# Patient Record
Sex: Female | Born: 1990 | Race: Black or African American | Hispanic: No | Marital: Single | Smoking: Never smoker
Health system: Southern US, Community
[De-identification: ages and names within clinical notes are randomized; demographics above are authoritative.]

## PROBLEM LIST (undated history)

## (undated) ENCOUNTER — Inpatient Hospital Stay (HOSPITAL_COMMUNITY): Payer: Self-pay

---

## 2015-06-21 ENCOUNTER — Ambulatory Visit (INDEPENDENT_AMBULATORY_CARE_PROVIDER_SITE_OTHER): Payer: BLUE CROSS/BLUE SHIELD | Admitting: Emergency Medicine

## 2015-06-21 VITALS — BP 110/76 | HR 74 | Temp 97.8°F | Resp 16 | Ht 67.0 in | Wt 105.0 lb

## 2015-06-21 DIAGNOSIS — Z23 Encounter for immunization: Secondary | ICD-10-CM

## 2015-06-21 NOTE — Patient Instructions (Signed)
Hepatitis B Vaccine: What You Need to Know  1. What is hepatitis B?  Hepatitis B is a serious infection that affects the liver. It is caused by the hepatitis B virus.   · In 2009, about 38,000 people became infected with hepatitis B.  · Each year about 2,000 to 4,000 people die in the [place] from cirrhosis or liver cancer caused by hepatitis B.  Hepatitis B can cause:  · Acute (short-term) illness. This can lead to:  · loss of appetite  · tiredness  · pain in muscles, joints, and stomach  · diarrhea and vomiting  · jaundice (yellow skin or eyes)  Acute illness, with symptoms, is more common among adults. Children who become infected usually do not have symptoms.  · Chronic (long-term) infection. Some people go on to develop chronic hepatitis B infection. Most of them do not have symptoms, but the infection is still very serious, and can lead to:  · liver damage (cirrhosis)  · liver cancer  · death  Chronic infection is more common among infants and children than among adults. People who are chronically infected can spread hepatitis B virus to others, even if they don't look or feel sick. Up to 1.4 million people in the [place] may have chronic hepatitis B infection.   Hepatitis B virus is easily spread through contact with the blood or other body fluids of an infected person. People can also be infected from contact with a contaminated object, where the virus can live for up to 7 days.  · A baby whose mother is infected can be infected at birth;  · Children, adolescents, and adults can become infected by:  ¨ contact with blood and body fluids through breaks in the skin such as bites, cuts, or sores;  ¨ contact with objects that have blood or body fluids on them such as toothbrushes, razors, or monitoring and treatment devices for diabetes;  ¨ having unprotected sex with an infected person;  ¨ sharing needles when injecting drugs;  ¨ being stuck with a used needle.  2. Hepatitis B vaccine: Why get  vaccinated?  Hepatitis B vaccine can prevent hepatitis B, and the serious consequences of hepatitis B infection, including liver cancer and cirrhosis.  Hepatitis B vaccine may be given by itself or in the same shot with other vaccines.  Routine hepatitis B vaccination was recommended for some [place] adults and children beginning in 1982, and for all children in 1991. Since 1990, new hepatitis B infections among children and adolescents have dropped by more than 95%--and by 75% in other age groups.  Vaccination gives long-term protection from hepatitis B infection, possibly lifelong.  3. Who should get hepatitis B vaccine and when?  Children and adolescents  · Babies normally get 3 doses of hepatitis B vaccine:  · 1st Dose: Birth  · 2nd Dose: 1-2 months of age  · 3rd Dose: 6-18 months of age  Some babies might get 4 doses, for example, if a combination vaccine containing hepatitis B is used. (This is a single shot containing several vaccines.) The extra dose is not harmful.  · Anyone through 24 years of age who didn't get the vaccine when they were younger should also be vaccinated.  Adults  · All unvaccinated adults at risk for hepatitis B infection should be vaccinated. This includes:  · sex partners of people infected with hepatitis B,  · men who have sex with men,  · people who inject street drugs,  ·   people with more than one sex partner,  · people with chronic liver or kidney disease,  · people under 60 years of age with diabetes,  · people with jobs that expose them to human blood or other body fluids,  · household contacts of people infected with hepatitis B,  · residents and staff in institutions for the developmentally disabled,  · kidney dialysis patients,  · people who travel to countries where hepatitis B is common,  · people with HIV infection.  · Other people may be encouraged by their doctor to get hepatitis B vaccine; for example, adults 60 and older with diabetes. Anyone else who wants to be protected  from hepatitis B infection may get the vaccine.  · Pregnant women who are at risk for one of the reasons stated above should be vaccinated. Other pregnant women who want protection may be vaccinated.  Adults getting hepatitis B vaccine should get 3 doses--with the second dose given 4 weeks after the first and the third dose 5 months after the second. Your doctor can tell you about other dosing schedules that might be used in certain circumstances.  4. Who should not get hepatitis B vaccine?  · Anyone with a life-threatening allergy to yeast, or to any other component of the vaccine, should not get hepatitis B vaccine. Tell your doctor if you have any severe allergies.  · Anyone who has had a life-threatening allergic reaction to a previous dose of hepatitis B vaccine should not get another dose.  · Anyone who is moderately or severely ill when a dose of vaccine is scheduled should probably wait until they recover before getting the vaccine.  Your doctor can give you more information about these precautions.  Note: You might be asked to wait 28 days before donating blood after getting hepatitis B vaccine. This is because the screening test could mistake vaccine in the bloodstream (which is not infectious) for hepatitis B infection.  5. What are the risks from hepatitis B vaccine?  Hepatitis B is a very safe vaccine. Most people do not have any problems with it.  The vaccine contains non-infectious material, and cannot cause hepatitis B infection.  Some mild problems have been reported:  · Soreness where the shot was given (up to about 1 person in 4).  · Temperature of 99.9°F or higher (up to about 1 person in 15).  Severe problems are extremely rare. Severe allergic reactions are believed to occur about once in 1.1 million doses.  A vaccine, like any medicine, could cause a serious reaction. But the risk of a vaccine causing serious harm, or death, is extremely small. More than 100 million people in the [place]  have been vaccinated with hepatitis B vaccine.  6. What if there is a serious reaction?  What should I look for?  · Look for anything that concerns you, such as signs of a severe allergic reaction, very high fever, or behavior changes.  Signs of a severe allergic reaction can include hives, swelling of the face and throat, difficulty breathing, a fast heartbeat, dizziness, and weakness. These would start a few minutes to a few hours after the vaccination.  What should I do?  · If you think it is a severe allergic reaction or other emergency that can't wait, call 9-1-1 or get the person to the nearest hospital. Otherwise, call your doctor.  · Afterward, the reaction should be reported to the Vaccine Adverse Event Reporting System (VAERS). Your doctor might file   this report, or you can do it yourself through the VAERS web site at www.vaers.hhs.gov, or by calling 1-800-822-7967.  VAERS is only for reporting reactions. They do not give medical advice.  7. The National Vaccine Injury Compensation Program  The National Vaccine Injury Compensation Program (VICP) is a federal program that was created to compensate people who may have been injured by certain vaccines.  Persons who believe they may have been injured by a vaccine can learn about the program and about filing a claim by calling 1-800-338-2382 or visiting the VICP website at www.hrsa.gov/vaccinecompensation.  8. How can I learn more?  · Ask your doctor.  · Call your local or state health department.  · Contact the Centers for Disease Control and Prevention (CDC):  ¨ Call 1-800-232-4636 (1-800-CDC-INFO) or  ¨ Visit CDC's website at www.cdc.gov/vaccines  CDC Hepatitis B Interim VIS (11/13/10)  Document Released: 07/23/2006 Document Revised: 02/12/2014 Document Reviewed: 11/09/2013  ExitCare® Patient Information ©2015 [employer]. This information is not intended to replace advice given to you by your health care provider. Make sure you discuss any questions you  have with your health care provider.  Tdap Vaccine (Tetanus, Diphtheria, Pertussis): What You Need to Know  1. Why get vaccinated?  Tetanus, diphtheria and pertussis can be very serious diseases, even for adolescents and adults. Tdap vaccine can protect us from these diseases.  TETANUS (Lockjaw) causes painful muscle tightening and stiffness, usually all over the body.  · It can lead to tightening of muscles in the head and neck so you can't open your mouth, swallow, or sometimes even breathe. Tetanus kills about 1 out of 5 people who are infected.  DIPHTHERIA can cause a thick coating to form in the back of the throat.  · It can lead to breathing problems, paralysis, heart failure, and death.  PERTUSSIS (Whooping Cough) causes severe coughing spells, which can cause difficulty breathing, vomiting and disturbed sleep.  · It can also lead to weight loss, incontinence, and rib fractures. Up to 2 in 100 adolescents and 5 in 100 adults with pertussis are hospitalized or have complications, which could include pneumonia or death.  These diseases are caused by bacteria. Diphtheria and pertussis are spread from person to person through coughing or sneezing. Tetanus enters the body through cuts, scratches, or wounds.  Before vaccines, the [place] saw as many as 200,000 cases a year of diphtheria and pertussis, and hundreds of cases of tetanus. Since vaccination began, tetanus and diphtheria have dropped by about 99% and pertussis by about 80%.  2. Tdap vaccine  Tdap vaccine can protect adolescents and adults from tetanus, diphtheria, and pertussis. One dose of Tdap is routinely given at age 11 or 12. People who did not get Tdap at that age should get it as soon as possible.  Tdap is especially important for health care professionals and anyone having close contact with a baby younger than 12 months.  Pregnant women should get a dose of Tdap during every pregnancy, to protect the newborn from pertussis. Infants are  most at risk for severe, life-threatening complications from pertussis.  A similar vaccine, called Td, protects from tetanus and diphtheria, but not pertussis. A Td booster should be given every 10 years. Tdap may be given as one of these boosters if you have not already gotten a dose. Tdap may also be given after a severe cut or burn to prevent tetanus infection.  Your doctor can give you more information.  Tdap may safely   be given at the same time as other vaccines.  3. Some people should not get this vaccine  · If you ever had a life-threatening allergic reaction after a dose of any tetanus, diphtheria, or pertussis containing vaccine, OR if you have a severe allergy to any part of this vaccine, you should not get Tdap. Tell your doctor if you have any severe allergies.  · If you had a coma, or long or multiple seizures within 7 days after a childhood dose of DTP or DTaP, you should not get Tdap, unless a cause other than the vaccine was found. You can still get Td.  · Talk to your doctor if you:  ¨ have epilepsy or another nervous system problem,  ¨ had severe pain or swelling after any vaccine containing diphtheria, tetanus or pertussis,  ¨ ever had Guillain-Barré Syndrome (GBS),  ¨ aren't feeling well on the day the shot is scheduled.  4. Risks of a vaccine reaction  With any medicine, including vaccines, there is a chance of side effects. These are usually mild and go away on their own, but serious reactions are also possible.  Brief fainting spells can follow a vaccination, leading to injuries from falling. Sitting or lying down for about 15 minutes can help prevent these. Tell your doctor if you feel dizzy or light-headed, or have vision changes or ringing in the ears.  Mild problems following Tdap  (Did not interfere with activities)  · Pain where the shot was given (about 3 in 4 adolescents or 2 in 3 adults)  · Redness or swelling where the shot was given (about 1 person in 5)  · Mild fever of at least  100.4°F (up to about 1 in 25 adolescents or 1 in 100 adults)  · Headache (about 3 or 4 people in 10)  · Tiredness (about 1 person in 3 or 4)  · Nausea, vomiting, diarrhea, stomach ache (up to 1 in 4 adolescents or 1 in 10 adults)  · Chills, body aches, sore joints, rash, swollen glands (uncommon)  Moderate problems following Tdap  (Interfered with activities, but did not require medical attention)  · Pain where the shot was given (about 1 in 5 adolescents or 1 in 100 adults)  · Redness or swelling where the shot was given (up to about 1 in 16 adolescents or 1 in 25 adults)  · Fever over 102°F (about 1 in 100 adolescents or 1 in 250 adults)  · Headache (about 3 in 20 adolescents or 1 in 10 adults)  · Nausea, vomiting, diarrhea, stomach ache (up to 1 or 3 people in 100)  · Swelling of the entire arm where the shot was given (up to about 3 in 100).  Severe problems following Tdap  (Unable to perform usual activities; required medical attention)  · Swelling, severe pain, bleeding and redness in the arm where the shot was given (rare).  A severe allergic reaction could occur after any vaccine (estimated less than 1 in a million doses).  5. What if there is a serious reaction?  What should I look for?  · Look for anything that concerns you, such as signs of a severe allergic reaction, very high fever, or behavior changes.  Signs of a severe allergic reaction can include hives, swelling of the face and throat, difficulty breathing, a fast heartbeat, dizziness, and weakness. These would start a few minutes to a few hours after the vaccination.  What should I do?  · If you   think it is a severe allergic reaction or other emergency that can't wait, call 9-1-1 or get the person to the nearest hospital. Otherwise, call your doctor.  · Afterward, the reaction should be reported to the "Vaccine Adverse Event Reporting System" (VAERS). Your doctor might file this report, or you can do it yourself through the VAERS web site at  www.vaers.hhs.gov, or by calling 1-800-822-7967.  VAERS is only for reporting reactions. They do not give medical advice.   6. The National Vaccine Injury Compensation Program  The National Vaccine Injury Compensation Program (VICP) is a federal program that was created to compensate people who may have been injured by certain vaccines.  Persons who believe they may have been injured by a vaccine can learn about the program and about filing a claim by calling 1-800-338-2382 or visiting the VICP website at www.hrsa.gov/vaccinecompensation.  7. How can I learn more?  · Ask your doctor.  · Call your local or state health department.  · Contact the Centers for Disease Control and Prevention (CDC):  ¨ Call 1-800-232-4636 or visit CDC's website at www.cdc.gov/vaccines.  CDC Tdap Vaccine VIS (02/18/12)  Document Released: 03/29/2012 Document Revised: 02/12/2014 Document Reviewed: 01/10/2014  ExitCare® Patient Information ©2015 [employer]. This information is not intended to replace advice given to you by your health care provider. Make sure you discuss any questions you have with your health care provider.

## 2015-06-21 NOTE — Progress Notes (Signed)
   Subjective:  Patient ID: Evelyn Cox, female    DOB: 1991-06-01  Age: 24 y.o. MRN: [id number]  CC: Immunizations   HPI Evelyn Cox presents  for immunizations .  She has never had hepatitis B series and is due for a tetanus booster she has no current complaint  History [place] has no past medical history on file.   She has no past surgical history on file.   Her  family history is not on file.  She   reports that she has never smoked. She has never used smokeless tobacco. She reports that she drinks about 1.2 oz of alcohol per week. She reports that she does not use illicit drugs.  No outpatient prescriptions prior to visit.   No facility-administered medications prior to visit.    Social History   Social History  . Marital Status: Single    Spouse Name: N/A  . Number of Children: N/A  . Years of Education: N/A   Social History Main Topics  . Smoking status: Never Smoker   . Smokeless tobacco: Never Used  . Alcohol Use: 1.2 oz/week    2 Standard drinks or equivalent per week  . Drug Use: No  . Sexual Activity: Not Asked   Other Topics Concern  . None   Social History Narrative  . None     Review of Systems  Constitutional: Negative for fever, chills and appetite change.  HENT: Negative for congestion, ear pain, postnasal drip, sinus pressure and sore throat.   Eyes: Negative for pain and redness.  Respiratory: Negative for cough, shortness of breath and wheezing.   Cardiovascular: Negative for leg swelling.  Gastrointestinal: Negative for nausea, vomiting, abdominal pain, diarrhea, constipation and blood in stool.  Endocrine: Negative for polyuria.  Genitourinary: Negative for dysuria, urgency, frequency and flank pain.  Musculoskeletal: Negative for gait problem.  Skin: Negative for rash.  Neurological: Negative for weakness and headaches.  Psychiatric/Behavioral: Negative for confusion and decreased concentration. The patient is not  nervous/anxious.     Objective:  BP 110/76 mmHg  Pulse 74  Temp(Src) 97.8 F (36.6 C) (Oral)  Resp 16  Ht  (1.702 m)  Wt 105 lb (47.628 kg)  BMI 16.44 kg/m2  SpO2 98%  LMP 06/07/2015 (Approximate)  Physical Exam  Constitutional: She is oriented to person, place, and time. She appears well-developed and well-nourished.  HENT:  Head: Normocephalic and atraumatic.  Eyes: Conjunctivae are normal. Pupils are equal, round, and reactive to light.  Pulmonary/Chest: Effort normal.  Musculoskeletal: She exhibits no edema.  Neurological: She is alert and oriented to person, place, and time.  Skin: Skin is dry.  Psychiatric: She has a normal mood and affect. Her behavior is normal. Thought content normal.      Assessment & Plan:   [place] was seen today for immunizations.  Diagnoses and all orders for this visit:  Need for hepatitis B vaccination  Need for Tdap vaccination  Other orders -     Tdap vaccine greater than or equal to 7yo IM -     Hepatitis B vaccine adult IM   [name] does not currently have medications on file.  No orders of the defined types were placed in this encounter.    Appropriate red flag conditions were discussed with the patient as well as actions that should be taken.  Patient expressed his understanding.  Follow-up: Return if symptoms worsen or fail to improve.  Evelyn Dane, MD

## 2015-10-08 ENCOUNTER — Emergency Department (HOSPITAL_COMMUNITY)
Admission: EM | Admit: 2015-10-08 | Discharge: 2015-10-08 | Disposition: A | Discharge status: Home / Self Care | Payer: BLUE CROSS/BLUE SHIELD | Attending: Emergency Medicine | Admitting: Emergency Medicine

## 2015-10-08 ENCOUNTER — Encounter (HOSPITAL_COMMUNITY): Payer: Self-pay | Admitting: Vascular Surgery

## 2015-10-08 DIAGNOSIS — J069 Acute upper respiratory infection, unspecified: Secondary | ICD-10-CM | POA: Diagnosis not present

## 2015-10-08 DIAGNOSIS — R05 Cough: Secondary | ICD-10-CM | POA: Diagnosis present

## 2015-10-08 DIAGNOSIS — R110 Nausea: Secondary | ICD-10-CM | POA: Insufficient documentation

## 2015-10-08 LAB — RAPID STREP SCREEN (MED CTR MEBANE ONLY): STREPTOCOCCUS, GROUP A SCREEN (DIRECT): NEGATIVE

## 2015-10-08 MED ORDER — IBUPROFEN 200 MG PO TABS
600.0000 mg | ORAL_TABLET | Freq: Once | ORAL | Status: DC
  Filled 2015-10-08: qty 3

## 2015-10-08 MED ORDER — GUAIFENESIN 100 MG/5ML PO LIQD: 100.0000 mg | ORAL | Status: DC | PRN

## 2015-10-08 MED ORDER — IBUPROFEN 100 MG/5ML PO SUSP
600.0000 mg | Freq: Once | ORAL | Status: AC
  Administered 2015-10-08: 600 mg via ORAL
  Filled 2015-10-08: qty 30

## 2015-10-08 MED ORDER — IBUPROFEN 100 MG/5ML PO SUSP: 600.0000 mg | Freq: Three times a day (TID) | ORAL | Status: DC | PRN

## 2015-10-08 MED ORDER — ALBUTEROL SULFATE HFA 108 (90 BASE) MCG/ACT IN AERS: 1.0000 | INHALATION_SPRAY | Freq: Four times a day (QID) | RESPIRATORY_TRACT | Status: DC | PRN

## 2015-10-08 NOTE — ED Provider Notes (Signed)
CSN: [id number]     Arrival date & time 10/08/15  1110 History  By signing my name below, I, Jarvis Morganaylor Ferguson, attest that this documentation has been prepared under the direction and in the presence of Trixie DredgeEmily Wyllow Seigler, PA-C Electronically Signed: Jarvis Morganaylor Ferguson, ED Scribe. 10/08/2015. 2:57 PM.   Chief Complaint  Patient presents with  . Cough  . Nausea   The history is provided by the patient. No language interpreter was used.    HPI Comments: Ivor CostaBrittany Nakata is a 24 y.o. female with no PMHx who presents to the Emergency Department complaining of an intermittent, moderate, cough productive of sputum onset 5 days. She reports associated chest pain and lightheadedness that is only present when coughing, along with nausea, sore throat, subjective fever, congestion and rhinorrhea. Pt notes she has been taking Tylenol and TheraFlu with no significant relief. She denies any aggravating factors. Pt notes that several coworkers have had URIs. She denies any family h/o DVT or PE. She denies any leg swelling, recent immobilization, recent hospitalization, or recent travel. Pt notes she has had a decreased appetite. She denies any SOB, trouble breathing, trouble swallowing, vomiting, diarrhea, abdominal pain, facial pain, or urinary symptoms.  History reviewed. No pertinent past medical history. History reviewed. No pertinent past surgical history. No family history on file. Social History  Substance Use Topics  . Smoking status: Never Smoker   . Smokeless tobacco: Never Used  . Alcohol Use: 1.2 oz/week    2 Standard drinks or equivalent per week   OB History    No data available     Review of Systems  Constitutional: Positive for fever (subjective). Negative for chills.  HENT: Positive for congestion and sore throat. Negative for trouble swallowing.   Respiratory: Positive for cough. Negative for shortness of breath.   Cardiovascular: Positive for chest pain (only when coughing). Negative for leg  swelling.  Gastrointestinal: Positive for nausea. Negative for vomiting, abdominal pain and diarrhea.  Genitourinary: Negative for dysuria, urgency, frequency and difficulty urinating.      Allergies  Review of patient's allergies indicates no known allergies.  Home Medications   Prior to Admission medications   Not on File   Triage Vitals: BP 113/92 mmHg  Pulse 70  Temp(Src) 97.9 F (36.6 C) (Oral)  Resp 16  SpO2 100%  LMP 09/29/2015 (Approximate)  Physical Exam  Constitutional: Vital signs are normal. She appears well-developed and well-nourished. She is active.  Non-toxic appearance. She does not have a sickly appearance. She does not appear ill. No distress.  HENT:  Head: Normocephalic and atraumatic.  Nose: Mucosal edema present.  Mouth/Throat: Posterior oropharyngeal edema and posterior oropharyngeal erythema present. No oropharyngeal exudate.  Erythema and edema of nasal mucosa  Eyes: Conjunctivae are normal.  Neck: Neck supple.  Cardiovascular: Normal rate and regular rhythm.   Pulmonary/Chest: Effort normal and breath sounds normal. No respiratory distress. She has no wheezes. She has no rales.  Musculoskeletal: She exhibits no edema.  Neurological: She is alert. She exhibits normal muscle tone.  Skin: She is not diaphoretic.  Psychiatric: She has a normal mood and affect. Her behavior is normal. Thought content normal.  Nursing note and vitals reviewed.   ED Course  Procedures (including critical care time)  DIAGNOSTIC STUDIES: Oxygen Saturation is 100% on RA, normal by my interpretation.    COORDINATION OF CARE: 3:00 PM- Will order 12 lead EKG and rapid strep screen. Pt advised of plan for treatment and pt agrees.   Labs  Review Labs Reviewed - No data to display  Imaging Review No results found. I have personally reviewed and evaluated these lab results as part of my medical decision-making.   EKG Interpretation None      MDM   Final  diagnoses:  URI (upper respiratory infection)   Afebrile, nontoxic patient with constellation of symptoms suggestive of viral syndrome.  No concerning findings on exam.  Discharged home with supportive care, PCP follow up.  Discussed result, findings, treatment, and follow up  with patient.  Pt given return precautions.  Pt verbalizes understanding and agrees with plan.       I personally performed the services described in this documentation, which was scribed in my presence. The recorded information has been reviewed and is accurate.      Trixie Dredge, PA-C 10/08/15 1732  Marily Memos, MD 10/10/15 1018

## 2015-10-08 NOTE — Discharge Instructions (Signed)
Read the information below.  Use the prescribed medication as directed.  Please discuss all new medications with your pharmacist.  You may return to the Emergency Department at any time for worsening condition or any new symptoms that concern you.  If you develop high fevers that do not resolve with tylenol or ibuprofen, you have difficulty swallowing or breathing, or you are unable to tolerate fluids by mouth, return to the ER for a recheck.    ° ° °Upper Respiratory Infection, Adult °Most upper respiratory infections (URIs) are a viral infection of the air passages leading to the lungs. A URI affects the nose, throat, and upper air passages. The most common type of URI is nasopharyngitis and is typically referred to as "the common cold." °URIs run their course and usually go away on their own. Most of the time, a URI does not require medical attention, but sometimes a bacterial infection in the upper airways can follow a viral infection. This is called a secondary infection. Sinus and middle ear infections are common types of secondary upper respiratory infections. °Bacterial pneumonia can also complicate a URI. A URI can worsen asthma and chronic obstructive pulmonary disease (COPD). Sometimes, these complications can require emergency medical care and may be life threatening.  °CAUSES °Almost all URIs are caused by viruses. A virus is a type of germ and can spread from one person to another.  °RISKS FACTORS °You may be at risk for a URI if:  °· You smoke.   °· You have chronic heart or lung disease. °· You have a weakened defense (immune) system.   °· You are very young or very old.   °· You have nasal allergies or asthma. °· You work in crowded or poorly ventilated areas. °· You work in health care facilities or schools. °SIGNS AND SYMPTOMS  °Symptoms typically develop 2-3 days after you come in contact with a cold virus. Most viral URIs last 7-10 days. However, viral URIs from the influenza virus (flu virus)  can last 14-18 days and are typically more severe. Symptoms may include:  °· Runny or stuffy (congested) nose.   °· Sneezing.   °· Cough.   °· Sore throat.   °· Headache.   °· Fatigue.   °· Fever.   °· Loss of appetite.   °· Pain in your forehead, behind your eyes, and over your cheekbones (sinus pain). °· Muscle aches.   °DIAGNOSIS  °Your health care provider may diagnose a URI by: °· Physical exam. °· Tests to check that your symptoms are not due to another condition such as: °¨ Strep throat. °¨ Sinusitis. °¨ Pneumonia. °¨ Asthma. °TREATMENT  °A URI goes away on its own with time. It cannot be cured with medicines, but medicines may be prescribed or recommended to relieve symptoms. Medicines may help: °· Reduce your fever. °· Reduce your cough. °· Relieve nasal congestion. °HOME CARE INSTRUCTIONS  °· Take medicines only as directed by your health care provider.   °· Gargle warm saltwater or take cough drops to comfort your throat as directed by your health care provider. °· Use a warm mist humidifier or inhale steam from a shower to increase air moisture. This may make it easier to breathe. °· Drink enough fluid to keep your urine clear or pale yellow.   °· Eat soups and other clear broths and maintain good nutrition.   °· Rest as needed.   °· Return to work when your temperature has returned to normal or as your health care provider advises. You may need to stay home longer to avoid infecting   others. You can also use a face mask and careful hand washing to prevent spread of the virus. °· Increase the usage of your inhaler if you have asthma.   °· Do not use any tobacco products, including cigarettes, chewing tobacco, or electronic cigarettes. If you need help quitting, ask your health care provider. °PREVENTION  °The best way to protect yourself from getting a cold is to practice good hygiene.  °· Avoid oral or hand contact with people with cold symptoms.   °· Wash your hands often if contact occurs.   °There is  no clear evidence that vitamin C, vitamin E, echinacea, or exercise reduces the chance of developing a cold. However, it is always recommended to get plenty of rest, exercise, and practice good nutrition.  °SEEK MEDICAL CARE IF:  °· You are getting worse rather than better.   °· Your symptoms are not controlled by medicine.   °· You have chills. °· You have worsening shortness of breath. °· You have brown or red mucus. °· You have yellow or brown nasal discharge. °· You have pain in your face, especially when you bend forward. °· You have a fever. °· You have swollen neck glands. °· You have pain while swallowing. °· You have white areas in the back of your throat. °SEEK IMMEDIATE MEDICAL CARE IF:  °· You have severe or persistent: °¨ Headache. °¨ Ear pain. °¨ Sinus pain. °¨ Chest pain. °· You have chronic lung disease and any of the following: °¨ Wheezing. °¨ Prolonged cough. °¨ Coughing up blood. °¨ A change in your usual mucus. °· You have a stiff neck. °· You have changes in your: °¨ Vision. °¨ Hearing. °¨ Thinking. °¨ Mood. °MAKE SURE YOU:  °· Understand these instructions. °· Will watch your condition. °· Will get help right away if you are not doing well or get worse. °  °This information is not intended to replace advice given to you by your health care provider. Make sure you discuss any questions you have with your health care provider. °  °Document Released: 03/24/2001 Document Revised: 02/12/2015 Document Reviewed: 01/03/2014 °Elsevier Interactive Patient Education ©2016 [employer]vier Inc[employer] ° ° ° °Emergency Department Resource Guide °1) Find a Doctor and Pay Out of Pocket °Although you won't have to find out who is covered by your insurance plan, it is a good idea to ask around and get recommendations. You will then need to call the office and see if the doctor you have chosen will accept you as a new patient and what types of options they offer for patients who are self-pay. Some doctors offer discounts or  will set up payment plans for their patients who do not have insurance, but you will need to ask so you aren't surprised when you get to your appointment. ° °2) Contact Your Local Health Department °Not all health departments have doctors that can see patients for sick visits, but many do, so it is worth a call to see if yours does. If you don't know where your local health department is, you can check in your phone book. The CDC also has a tool to help you locate your state's health department, and many state websites also have listings of all of their local health departments. ° °3) Find a Walk-in Clinic °If your illness is not likely to be very severe or complicated, you may want to try a walk in clinic. These are popping up all over the country in pharmacies, drugstores, and shopping centers. They're usually staffed   by nurse practitioners or physician assistants that have been trained to treat common illnesses and complaints. They're usually fairly quick and inexpensive. However, if you have serious medical issues or chronic medical problems, these are probably not your best option. ° °No Primary Care Doctor: °- Call [facility] at  832-8000 - they can help you locate a primary care doctor that  accepts your insurance, provides certain services, etc. °- Physician Referral Service- 1-800-533-3463 ° °Chronic Pain Problems: °Organization         Address  Phone   Notes  °[facility]  (336) 297-2271 Patients need to be referred by their primary care doctor.  ° °Medication Assistance: °Organization         Address  Phone   Notes  °[place] [facility] [address] °[place], [place] [address] (336) 641-8030 --Must be a resident of [place] °-- Must have NO insurance coverage whatsoever (no Medicaid/ Medicare, etc.) °-- The pt. MUST have a primary care doctor that directs their care regularly and follows them in the community °  °[facility]  (866)  331-1348   °[employer]  (888) 892-1162   ° °Agencies that provide inexpensive medical care: °Organization         Address  Phone   Notes  °[facility]  (336) 832-8035   °[facility]    (336) 832-7272   °[facility] [address] °[place], [place] [address] (336) 832-4777   °[facility] [address], °[place] (336) 271-4999   °[facility]    (336) 373-0678   °[facility]    (336) 272-1050   °[facility] ° [address], [place] Phone:  (336) 832-4444, Fax:  (336) 832-4440 Hours of Operation:  9 am - 6 pm, M-F.  Also accepts Medicaid/Medicare and self-pay.  °[facility] ° [address], [place] Phone: (336) [phone], Fax: (336) [phone]. Hours of Operation:  8:30 am - 5:30 pm, M-F.  Also accepts Medicaid and self-pay.  °[facility] [place] [address], [place] Phone: (336) 878-6027   °[facility] [address], [place], [place] (336)723-1848, Ext. 123 Mondays & Thursdays: 7-9 AM.  First 15 patients are seen on a first come, first serve basis. °  ° °Medicaid-accepting [place] Providers: ° °Organization         Address  Phone   Notes  °[facility] [address], [place] (336) 641-2100 Also accepts self-pay patients.  °[facility] [address], [place] ° (336) 856-9996   °[facility] [address], [place] (336) 288-8857   °[facility] [address] [place] [address], [place] (336) 299-7000   °Veita Bland [address], [place]  ° (336) 373-1557 Only accepts [place] Access Medicaid patients after they have their name applied to their card.  ° °Self-Pay (no insurance) in [place]: ° °Organization         Address  Phone   Notes  °Sickle Cell Patients, [facility] [address], [place] (336) 832-1970   °[facility] [address],  (336) 832-4400   °[facility] [place] ° [address],  (336) 992-4800   °[facility]/Dr.   Osei-Bonsu ° [address] [place] [address], [place] or [address], [place] (336) [phone] Phone number for both [place] and [place] locations is the same.  °[facility] [address], [place] (336) 299-0000   °[facility] [address] [place] [address], [place] or [address] (336) 852-7530 °(336) 878-2260   °[facility] [address], [place] (336) 350-1642, phone; (336) 294-5005, fax Sees patients 1st and 3rd Saturday of every month.  Must not qualify for public or private insurance (i.e. Medicaid, Medicare, [place] Health Choice, Veterans' Benefits) • Household income should be no more than 200% of the poverty level •The clinic cannot treat you if you are pregnant or think you are pregnant • Sexually transmitted diseases are not treated at the clinic.  ° ° °Dental Care: °Organization         Address  Phone  Notes  °[facility] [address], [place] (336) 641-6152 Accepts children up to age 21 who are enrolled in Medicaid or [place] Health Choice; pregnant women with a Medicaid card; and children who have applied for Medicaid or [place] Health Choice, but were declined, whose parents can pay a reduced fee at time of service.  °[facility] [place]  [address], [place] (336) 641-7733 Accepts children up to age 21 who are enrolled in Medicaid or [place] Health Choice; pregnant women with a Medicaid card; and children who have applied for Medicaid or [place] Health Choice, but were declined, whose parents can pay a reduced fee at time of service.  °[facility] ° [address], [place] (336)  641-4533 Patients are seen by appointment only. Walk-ins are not accepted. [facility] will see patients 18 years of age and older. °Monday - Tuesday (8am-5pm) °Most Wednesdays (8:30-5pm) °$30 per visit, cash only  °[facility] ° [address], [place] (336) 641-4533 Patients are seen by appointment only. Walk-ins are not accepted. [facility] will see patients 18 years of age and older. °One Wednesday Evening (Monthly: Volunteer Based).  $30 per visit, cash only  °[facility]  (919) 537-3737 for adults; Children under age 4, call Graduate Pediatric Dentistry at ([phone]) [phone]. Children aged 4-14, please call (919) [phone] to request a pediatric application. ° Dental services are provided in all areas of dental care including fillings, crowns and bridges, complete and partial dentures, implants, gum treatment, root canals, and extractions. Preventive care is also provided. Treatment is provided to both adults and children. °Patients are selected via a lottery and there is often a waiting list. °  °[facility] [address], ° ° (336) 763-8833 [email or web address] °  °[facility] [address], [place], [place] (336)723-1848, Ext. 123 Second and Fourth Thursday of each month, opens at 6:30 AM; Clinic ends at 9 AM.  Patients are seen on a first-come first-served basis, and a limited number are seen during each clinic.  ° °[facility] ° [address], [place], [place] (336) 723-7904   Eligibility Requirements °You must have lived in [place], [place], or [place] counties for at least the last three months. °  You cannot be eligible for state or federal sponsored healthcare insurance, including Veterans Administration, Medicaid, or Medicare. °  You generally cannot be eligible for healthcare insurance through your employer.  °    How to apply: °Eligibility screenings are held every Tuesday and Wednesday afternoon  from 1:00 pm until 4:00 pm. You do not need an appointment for the interview!  °[facility] [address], [place], [place] 336-631-2330   °[facility]  336-342-8273   °[facility]  336-703-3100   °[facility]  336-570-6415   ° °Behavioral Health Resources in the Community: °Intensive Outpatient Programs °Organization         Address  Phone  Notes  °[facility] [address], [place], [place] 336-878-6098   °[facility] [address], [place], [place] 336-832-9800   °[facility] [address], [place], [place] ° 336-882-2125   °[facility] [address],  °[place], [place] 1-800-853-5163 or 336-641-4981   °Substance Abuse Resources °Organization         Address  Phone  Notes  °[facility]  336-882-2125   °[facility]  336-784-9470   °[facility]  336-285-9073   °[facility]  336-845-3988   °Residential & Outpatient Substance Abuse Program  1-800-659-3381   °Psychological Services °Organization         Address  Phone  Notes  °[facility]  336- 832-9600   °[facility]  336- 378-7881   °[facility] [address], [place] 1-800-853-5163 or 336-641-4981   ° °Mobile Crisis Teams °Organization         Address  Phone  Notes  °[facility], Mobile Crisis Care Unit  1-877-626-1772   °Assertive °[facility] ° [address] [place], [place] 336-834-9664   °Sharon DeEsch [address] °[place] 336-554-5454   ° °Self-Help/Support Groups °Organization         Address  Phone             Notes  °[facility] - variety of support groups  336- 373-1402 Call for more information  °Narcotics Anonymous (NA), [facility] [address], °[place]  2 meetings at this location  ° °Residential Treatment  Programs °Organization         Address  Phone  Notes  °[facility] [address],    °[place]  1-866-801-8205   °[facility] ° [address], [place], [place] 704-293-8524   °[facility] [address], [place] 336-845-3988 Admissions: 8am-3pm M-F  °[facility] [address]    °[place], [place] 336-841-1104   °[facility] [address], [place], [place] 336-379-7146   °[facility] [address]  °[place], [place] 336-285-9073   °[facility] - Intensive Outpatient [address], , [place] 336-852-3033   °[facility] [address]  °[place], [place] 1-877-615-2722 or 336-784-9470   °[facility] [address] [place], [place] 336-227-7417 Accepts Medicaid  °[facility] [address]  ° [place] 1-800-659-3381 Substance Abuse/Addiction Treatment  ° °[place] Behavioral Health Resources °Organization         Address  Phone  Notes  °[facility]  (888) 581-9988   °Julie Brannon, PhD [address] [place], [place]   (336) 349-5553 or (336) 951-0000   °[facility]   [address] °[place],  (336) 349-4454   °[facility] [address], [place], [place] (336) 342-8316 Insurance/Medicaid/sponsorship through [facility]  °[facility] [address]                                    [place], [place] (336) 342-8316 Therapy/tele-psych/case  °[facility] [address]  ° [place], [place] (336) 349-2233    °Dr. Arfeen  (336) 349-4544   °[facility] [place]  [employer] [place] [facility] 1) [address], [place] °2) [address], [place] °3)  [address], [place] (336) 349-3220 °(336) 342-7768 ° °(336) 342-8140   °[place] [facility] (336) 342-1394 or (336) 342-3537 (After Hours)    ° ° ° °

## 2015-10-08 NOTE — ED Notes (Signed)
Pt reports to the ED for eval of productive cough, nasal congestion/drainage, sore throat, chest soreness, and nausea. Pt reports her symptoms began on Friday. Reports she has been taking Tylenol and Theraflu but it is not helping. Pt denies any V/D, urinary symptoms, or abd pain. Pt A&Ox4, resp e/u, and skin warm and dry.

## 2015-10-10 LAB — CULTURE, GROUP A STREP

## 2015-10-13 NOTE — L&D Delivery Note (Signed)
25 y.o. [id number] at [id number] delivered a viable female infant in cephalic, OA position.  nuchal cord x 1, easily reduced. Right anterior shoulder delivered with ease. 60 sec delayed cord clamping. Cord clamped x2 and cut. Placenta delivered spontaneously intact, with 3VC. Fundus firm on exam with massage and pitocin. Good hemostasis noted.  Laceration: none Suture: n/a EBL: 150cc Good hemostasis noted.  Mom and baby recovering in LDR.    Apgars: 6,8 Weight: pending, skin to skin, see delivery summary  Cord blood obtained: yes   WALLACE, NOAH I, DO PGY-3 09/08/2016, 6:26 PM    OB FELLOW DELIVERY ATTESTATION  I was gloved and present for the delivery in its entirety, and I agree with the above resident's note.    Ernestina PennaNicholas Schenk, MD 6:38 PM

## 2016-01-23 ENCOUNTER — Emergency Department (HOSPITAL_COMMUNITY)
Admission: EM | Admit: 2016-01-23 | Discharge: 2016-01-23 | Disposition: A | Discharge status: Home / Self Care | Payer: BLUE CROSS/BLUE SHIELD | Attending: Emergency Medicine | Admitting: Emergency Medicine

## 2016-01-23 ENCOUNTER — Encounter (HOSPITAL_COMMUNITY): Payer: Self-pay

## 2016-01-23 DIAGNOSIS — Z79899 Other long term (current) drug therapy: Secondary | ICD-10-CM | POA: Insufficient documentation

## 2016-01-23 DIAGNOSIS — R110 Nausea: Secondary | ICD-10-CM | POA: Insufficient documentation

## 2016-01-23 DIAGNOSIS — J069 Acute upper respiratory infection, unspecified: Secondary | ICD-10-CM | POA: Diagnosis not present

## 2016-01-23 DIAGNOSIS — M791 Myalgia: Secondary | ICD-10-CM | POA: Diagnosis present

## 2016-01-23 MED ORDER — OXYMETAZOLINE HCL 0.05 % NA SOLN: 1.0000 | Freq: Two times a day (BID) | NASAL | Status: DC

## 2016-01-23 NOTE — ED Notes (Signed)
Patient here with generalized body aches, fever and cough x 2 days

## 2016-01-23 NOTE — Discharge Instructions (Signed)
Take your medication as prescribed. I also recommend taking Tylenol and/or ibuprofen as prescribed over-the-counter as needed for headache and body aches. Continue to drink at least six 8 ounce glasses of water daily to remain hydrated at home. You may also take Delsym or other over-the-counter antitussives as needed for ear cough. Follow-up with one of the physicians listed in the resource guide provided below. Return to the emergency department if symptoms worsen or new onset of uncontrollable fever, decreased oral intake, vomiting, difficulty breathing, wheezing, chest pain, coughing up blood, abdominal pain.

## 2016-01-23 NOTE — ED Notes (Signed)
Declined W/C at D/C and was escorted to lobby by RN. 

## 2016-01-23 NOTE — ED Provider Notes (Signed)
CSN: [id number]     Arrival date & time 01/23/16  0909 History  By signing my name below, I, Ronney LionSuzanne Le, attest that this documentation has been prepared under the direction and in the presence of Melburn HakeNicole Nadeau, [place]. Electronically Signed: Ronney LionSuzanne Le, ED Scribe. 01/23/2016. 12:16 PM.    Chief Complaint  Patient presents with  . Generalized Body Aches   The history is provided by the patient. No language interpreter was used.    HPI Comments: Ivor CostaBrittany Shrewsberry is a 25 y.o. female who presents to the Emergency Department with multiple complaints, including generalized myalgias, that began 2 days ago. Associated symptoms include a frontal sinus pressure, nausea, sneezing, nasal congestion, yellow/occasionally blood-tinged rhinorrhea, subjective fever and chills, productive cough, SOB, chest wall pain with coughing, and a scratchy throat (resolved). 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 She reports she had taken OTC medications, including Theraflu and Mucinex, with minimal relief. She denies a history of any chronic medical conditions. She denies eye itching, hemoptysis, wheezing, abdominal pain, vomiting, diarrhea, dysuria.  History reviewed. No pertinent past medical history. History reviewed. No pertinent past surgical history. No family history on file. Social History  Substance Use Topics  . Smoking status: Never Smoker   . Smokeless tobacco: Never Used  . Alcohol Use: 1.2 oz/week    2 Standard drinks or equivalent per week   OB History    No data available     Review of Systems  Constitutional: Positive for fever (subjective) and chills.  HENT: Positive for congestion, rhinorrhea, sneezing and sore throat (resolved).   Eyes: Negative for itching.  Respiratory: Positive for cough and shortness of breath. Negative for wheezing.   Cardiovascular: Positive for chest pain (chest wall pain associated with cough).   Gastrointestinal: Positive for nausea. Negative for vomiting, abdominal pain and diarrhea.  Genitourinary: Negative for dysuria and menstrual problem.  Musculoskeletal: Positive for myalgias (generalized).  Neurological: Positive for headaches.      Allergies  Review of patient's allergies indicates no known allergies.  Home Medications   Prior to Admission medications   Medication Sig Start Date End Date Taking? Authorizing Provider  albuterol (PROVENTIL HFA;VENTOLIN HFA) 108 (90 Base) MCG/ACT inhaler Inhale 1-2 puffs into the lungs every 6 (six) hours as needed for wheezing or shortness of breath. 10/08/15   Trixie DredgeEmily West, PA-C  guaiFENesin (ROBITUSSIN) 100 MG/5ML liquid Take 5-10 mLs (100-200 mg total) by mouth every 4 (four) hours as needed for cough. 10/08/15   Trixie DredgeEmily West, PA-C  ibuprofen (ADVIL,MOTRIN) 100 MG/5ML suspension Take 30 mLs (600 mg total) by mouth every 8 (eight) hours as needed for fever, mild pain or moderate pain. 10/08/15   Trixie DredgeEmily West, PA-C  oxymetazoline (AFRIN NASAL SPRAY) 0.05 % nasal spray Place 1 spray into both nostrils 2 (two) times daily. 01/23/16   Satira SarkNicole Elizabeth Nadeau, PA-C   BP 107/74 mmHg  Pulse 80  Temp(Src) 98.9 F (37.2 C)  Resp 18  SpO2 100%  LMP 12/18/2015 Physical Exam  Constitutional: She is oriented to person, place, and time. She appears well-developed and well-nourished.  HENT:  Head: Normocephalic and atraumatic.  Right Ear: Tympanic membrane normal.  Left Ear: Tympanic membrane normal.  Nose: Rhinorrhea present. Right sinus exhibits maxillary sinus tenderness. Right sinus exhibits no frontal sinus tenderness. Left sinus exhibits maxillary sinus tenderness. Left sinus exhibits no frontal sinus tenderness.  Mouth/Throat: Uvula is midline, oropharynx is clear and moist and mucous  membranes are normal. No oropharyngeal exudate, posterior oropharyngeal edema, posterior oropharyngeal erythema or tonsillar abscesses.  Eyes: Conjunctivae and  EOM are normal. Right eye exhibits no discharge. Left eye exhibits no discharge. No scleral icterus.  Neck: Normal range of motion. Neck supple.  Cardiovascular: Normal rate, regular rhythm, normal heart sounds and intact distal pulses.   Pulmonary/Chest: Effort normal and breath sounds normal. No respiratory distress. She has no wheezes. She has no rales.  Abdominal: Soft. Bowel sounds are normal. There is no tenderness.  Musculoskeletal: She exhibits no edema.  Lymphadenopathy:    She has no cervical adenopathy.  Neurological: She is alert and oriented to person, place, and time.  Skin: Skin is warm and dry.  Nursing note and vitals reviewed.   ED Course  Procedures (including critical care time)  DIAGNOSTIC STUDIES: Oxygen Saturation is 100% on RA, normal by my interpretation.    COORDINATION OF CARE: 12:01 PM - Discussed treatment plan with pt at bedside. Pt verbalized understanding and agreed to plan.  MDM   Final diagnoses:  URI (upper respiratory infection)   Patient's symptoms are consistent with URI, likely viral etiology. Discussed that antibiotics are not indicated for viral infections. Pt will be discharged with symptomatic treatment, including nasal spray.  Discussed OTC antihistamine medications in case pt starts to develop seasonal allergy symptoms, including eye itching. Advised alternating Tylenol and ibuprofen 600 mg as needed for fever, body aches and headache/sinus pressure. Strict return precautions given. Will give work note for 2 days. Pt verbalizes understanding and is agreeable with plan. Pt is hemodynamically stable & in NAD prior to dc.   I personally performed the services described in this documentation, which was scribed in my presence. The recorded information has been reviewed and is accurate.      Satira Sark [place], [place] 01/23/16 1222  Derwood Kaplan, MD 01/24/16 [phone]

## 2016-02-07 ENCOUNTER — Inpatient Hospital Stay (HOSPITAL_COMMUNITY)
Admission: AD | Admit: 2016-02-07 | Discharge: 2016-02-07 | Disposition: A | Discharge status: Home / Self Care | Payer: BLUE CROSS/BLUE SHIELD | Source: Ambulatory Visit | Attending: Obstetrics & Gynecology | Admitting: Obstetrics & Gynecology

## 2016-02-07 ENCOUNTER — Inpatient Hospital Stay (HOSPITAL_COMMUNITY): Payer: BLUE CROSS/BLUE SHIELD

## 2016-02-07 ENCOUNTER — Encounter (HOSPITAL_COMMUNITY): Payer: Self-pay | Admitting: *Deleted

## 2016-02-07 DIAGNOSIS — Z3A01 Less than 8 weeks gestation of pregnancy: Secondary | ICD-10-CM | POA: Diagnosis not present

## 2016-02-07 DIAGNOSIS — R102 Pelvic and perineal pain: Secondary | ICD-10-CM | POA: Diagnosis not present

## 2016-02-07 DIAGNOSIS — R109 Unspecified abdominal pain: Secondary | ICD-10-CM | POA: Diagnosis present

## 2016-02-07 DIAGNOSIS — O26891 Other specified pregnancy related conditions, first trimester: Secondary | ICD-10-CM | POA: Diagnosis not present

## 2016-02-07 DIAGNOSIS — Z3491 Encounter for supervision of normal pregnancy, unspecified, first trimester: Secondary | ICD-10-CM

## 2016-02-07 DIAGNOSIS — O9989 Other specified diseases and conditions complicating pregnancy, childbirth and the puerperium: Secondary | ICD-10-CM | POA: Diagnosis not present

## 2016-02-07 LAB — URINALYSIS, ROUTINE W REFLEX MICROSCOPIC
Bilirubin Urine: NEGATIVE
Glucose, UA: NEGATIVE mg/dL
Hgb urine dipstick: NEGATIVE
Ketones, ur: NEGATIVE mg/dL
Leukocytes, UA: NEGATIVE
Nitrite: NEGATIVE
Protein, ur: NEGATIVE mg/dL
Specific Gravity, Urine: >1.03 — ABNORMAL HIGH (ref 1.005–1.030)
pH: 6 (ref 5.0–8.0)

## 2016-02-07 LAB — POCT PREGNANCY, URINE: Preg Test, Ur: POSITIVE — AB

## 2016-02-07 LAB — WET PREP, GENITAL
Sperm: NONE SEEN
Trich, Wet Prep: NONE SEEN
Yeast Wet Prep HPF POC: NONE SEEN

## 2016-02-07 LAB — CBC
HCT: 34.5 % — ABNORMAL LOW (ref 36.0–46.0)
Hemoglobin: 11.4 g/dL — ABNORMAL LOW (ref 12.0–15.0)
MCH: 27.7 pg (ref 26.0–34.0)
MCHC: 33 g/dL (ref 30.0–36.0)
MCV: 83.7 fL (ref 78.0–100.0)
Platelets: 273 10*3/uL (ref 150–400)
RBC: 4.12 MIL/uL (ref 3.87–5.11)
RDW: 12.9 % (ref 11.5–15.5)
WBC: 6 10*3/uL (ref 4.0–10.5)

## 2016-02-07 LAB — ABO/RH: ABO/RH(D): O POS

## 2016-02-07 LAB — HCG, QUANTITATIVE, PREGNANCY: hCG, Beta Chain, Quant, S: 20645 m[IU]/mL — ABNORMAL HIGH (ref <5)

## 2016-02-07 NOTE — MAU Note (Signed)
Patient presents with possible pregnancy and c/o headaches, abdominal pain and back pain X 2 weeks. Denies bleeding or discharge.

## 2016-02-07 NOTE — Progress Notes (Signed)
Patient ID: Evelyn Cox, female   DOB: 08-Jul-1991, 25 y.o.   MRN: [id number]   History     CSN: [id number]  Arrival date and time: 02/07/16 1420   First Provider Initiated Contact with Patient 02/07/16 1530      Chief Complaint  Patient presents with  . Abdominal Pain  . Back Pain  . Headache   HPI  Evelyn Cox, a 25 yo G2P2 with no significant PMH, presents with a 2 week history of worsening, persistent lower abdominal pain in the setting of a positive urine pregnancy test. Patient also complains of backaches and headaches. She reports that the pain comes and goes during the day, but is relieved by sleep. She has not tried any medicines. The lower abdominal pain is crampy and "gassy," but she denies any constipation (last BM yesterday), diarrhea. She endorses nausea in the absence of vomiting. She also mentions a persistent white "creamy" vaginal discharge over the past week.   OB History    Gravida Para Term Preterm AB TAB SAB Ectopic Multiple Living   History reviewed. No pertinent past medical history.  History reviewed. No pertinent past surgical history.  History reviewed. No pertinent family history.  Social History  Substance Use Topics  . Smoking status: Never Smoker   . Smokeless tobacco: Never Used  . Alcohol Use: 1.2 oz/week    2 Standard drinks or equivalent per week    Allergies: No Known Allergies  Prescriptions prior to admission  Medication Sig Dispense Refill Last Dose  . albuterol (PROVENTIL HFA;VENTOLIN HFA) 108 (90 Base) MCG/ACT inhaler Inhale 1-2 puffs into the lungs every 6 (six) hours as needed for wheezing or shortness of breath. 1 Inhaler 0 rescue  . oxymetazoline (AFRIN NASAL SPRAY) 0.05 % nasal spray Place 1 spray into both nostrils 2 (two) times daily. (Patient not taking: Reported on 02/07/2016) 30 mL 0 Not Taking at Unknown time    Review of Systems  Constitutional: Negative for fever and chills.  Respiratory:  Negative for cough, shortness of breath and wheezing.   Cardiovascular: Negative for chest pain and palpitations.  Gastrointestinal: Positive for nausea and abdominal pain. Negative for heartburn, vomiting, diarrhea, constipation, blood in stool and melena.  Genitourinary: Negative for dysuria, urgency, frequency, hematuria and flank pain.  Musculoskeletal: Positive for myalgias and back pain. Negative for neck pain.  Neurological: Positive for headaches.     Physical Exam   Blood pressure 114/76, pulse 82, temperature 97.9 F (36.6 C), temperature source Oral, resp. rate 16, height 5' 6.5" (1.689 m), weight 49.329 kg (108 lb 12 oz), last menstrual period 12/31/2015.  Physical Exam  Constitutional: She appears well-developed and well-nourished.  Cardiovascular: Normal rate, regular rhythm and intact distal pulses.  Exam reveals no gallop and no friction rub.   No murmur heard. Respiratory: Effort normal and breath sounds normal. No respiratory distress. She has no wheezes. She has no rales. She exhibits no tenderness.  GI: Soft. Bowel sounds are normal. She exhibits no distension and no mass. There is no tenderness. There is no rebound and no guarding.  Cervical exam: scant white cervical discharge; no cervical motion tenderness  MAU Course  Procedures  MDM Differential includes physiologic changes of pregnancy, STI given the discharge, or ectopic pregnancy given the continuous abdominal pain.  Assessment and Plan  Evelyn Cox, a 25 yo G2P2 with no significant PMH, presents with a 2 week  history of worsening, persistent lower abdominal pain in the setting of a positive urine pregnancy test.  Plan is to rule out ectopic pregnancy with a CBC, ultrasound, and B-hCG, as well as test for STIs using a GC swab and wet prep.   Evelyn Cox 02/07/2016, 3:31 PM

## 2016-02-07 NOTE — Discharge Instructions (Signed)
First Trimester of Pregnancy The first trimester of pregnancy is from week 1 until the end of week 12 (months 1 through 3). A week after a sperm fertilizes an egg, the egg will implant on the wall of the uterus. This embryo will begin to develop into a baby. Genes from you and your partner are forming the baby. The female genes determine whether the baby is a boy or a girl. At 6-8 weeks, the eyes and face are formed, and the heartbeat can be seen on ultrasound. At the end of 12 weeks, all the baby's organs are formed.  Now that you are pregnant, you will want to do everything you can to have a healthy baby. Two of the most important things are to get good prenatal care and to follow your health care provider's instructions. Prenatal care is all the medical care you receive before the baby's birth. This care will help prevent, find, and treat any problems during the pregnancy and childbirth. BODY CHANGES Your body goes through many changes during pregnancy. The changes vary from woman to woman.   You may gain or lose a couple of pounds at first.  You may feel sick to your stomach (nauseous) and throw up (vomit). If the vomiting is uncontrollable, call your health care provider.  You may tire easily.  You may develop headaches that can be relieved by medicines approved by your health care provider.  You may urinate more often. Painful urination may mean you have a bladder infection.  You may develop heartburn as a result of your pregnancy.  You may develop constipation because certain hormones are causing the muscles that push waste through your intestines to slow down.  You may develop hemorrhoids or swollen, bulging veins (varicose veins).  Your breasts may begin to grow larger and become tender. Your nipples may stick out more, and the tissue that surrounds them (areola) may become darker.  Your gums may bleed and may be sensitive to brushing and flossing.  Dark spots or blotches (chloasma,  mask of pregnancy) may develop on your face. This will likely fade after the baby is born.  Your menstrual periods will stop.  You may have a loss of appetite.  You may develop cravings for certain kinds of food.  You may have changes in your emotions from day to day, such as being excited to be pregnant or being concerned that something may go wrong with the pregnancy and baby.  You may have more vivid and strange dreams.  You may have changes in your hair. These can include thickening of your hair, rapid growth, and changes in texture. Some women also have hair loss during or after pregnancy, or hair that feels dry or thin. Your hair will most likely return to normal after your baby is born. WHAT TO EXPECT AT YOUR PRENATAL VISITS During a routine prenatal visit:  You will be weighed to make sure you and the baby are growing normally.  Your blood pressure will be taken.  Your abdomen will be measured to track your baby's growth.  The fetal heartbeat will be listened to starting around week 10 or 12 of your pregnancy.  Test results from any previous visits will be discussed. Your health care provider may ask you:  How you are feeling.  If you are feeling the baby move.  If you have had any abnormal symptoms, such as leaking fluid, bleeding, severe headaches, or abdominal cramping.  If you are using any tobacco products,   including cigarettes, chewing tobacco, and electronic cigarettes.  If you have any questions. Other tests that may be performed during your first trimester include:  Blood tests to find your blood type and to check for the presence of any previous infections. They will also be used to check for low iron levels (anemia) and Rh antibodies. Later in the pregnancy, blood tests for diabetes will be done along with other tests if problems develop.  Urine tests to check for infections, diabetes, or protein in the urine.  An ultrasound to confirm the proper growth  and development of the baby.  An amniocentesis to check for possible genetic problems.  Fetal screens for spina bifida and Down syndrome.  You may need other tests to make sure you and the baby are doing well.  HIV (human immunodeficiency virus) testing. Routine prenatal testing includes screening for HIV, unless you choose not to have this test. HOME CARE INSTRUCTIONS  Medicines  Follow your health care provider's instructions regarding medicine use. Specific medicines may be either safe or unsafe to take during pregnancy.  Take your prenatal vitamins as directed.  If you develop constipation, try taking a stool softener if your health care provider approves. Diet  Eat regular, well-balanced meals. Choose a variety of foods, such as meat or vegetable-based protein, fish, milk and low-fat dairy products, vegetables, fruits, and whole grain breads and cereals. Your health care provider will help you determine the amount of weight gain that is right for you.  Avoid raw meat and uncooked cheese. These carry germs that can cause birth defects in the baby.  Eating four or five small meals rather than three large meals a day may help relieve nausea and vomiting. If you start to feel nauseous, eating a few soda crackers can be helpful. Drinking liquids between meals instead of during meals also seems to help nausea and vomiting.  If you develop constipation, eat more high-fiber foods, such as fresh vegetables or fruit and whole grains. Drink enough fluids to keep your urine clear or pale yellow. Activity and Exercise  Exercise only as directed by your health care provider. Exercising will help you:  Control your weight.  Stay in shape.  Be prepared for labor and delivery.  Experiencing pain or cramping in the lower abdomen or low back is a good sign that you should stop exercising. Check with your health care provider before continuing normal exercises.  Try to avoid standing for long  periods of time. Move your legs often if you must stand in one place for a long time.  Avoid heavy lifting.  Wear low-heeled shoes, and practice good posture.  You may continue to have sex unless your health care provider directs you otherwise. Relief of Pain or Discomfort  Wear a good support bra for breast tenderness.   Take warm sitz baths to soothe any pain or discomfort caused by hemorrhoids. Use hemorrhoid cream if your health care provider approves.   Rest with your legs elevated if you have leg cramps or low back pain.  If you develop varicose veins in your legs, wear support hose. Elevate your feet for 15 minutes, 3-4 times a day. Limit salt in your diet. Prenatal Care  Schedule your prenatal visits by the twelfth week of pregnancy. They are usually scheduled monthly at first, then more often in the last 2 months before delivery.  Write down your questions. Take them to your prenatal visits.  Keep all your prenatal visits as directed by your   health care provider. Safety  Wear your seat belt at all times when driving.  Make a list of emergency phone numbers, including numbers for family, friends, the hospital, and police and fire departments. General Tips  Ask your health care provider for a referral to a local prenatal education class. Begin classes no later than at the beginning of month 6 of your pregnancy.  Ask for help if you have counseling or nutritional needs during pregnancy. Your health care provider can offer advice or refer you to specialists for help with various needs.  Do not use hot tubs, steam rooms, or saunas.  Do not douche or use tampons or scented sanitary pads.  Do not cross your legs for long periods of time.  Avoid cat litter boxes and soil used by cats. These carry germs that can cause birth defects in the baby and possibly loss of the fetus by miscarriage or stillbirth.  Avoid all smoking, herbs, alcohol, and medicines not prescribed by  your health care provider. Chemicals in these affect the formation and growth of the baby.  Do not use any tobacco products, including cigarettes, chewing tobacco, and electronic cigarettes. If you need help quitting, ask your health care provider. You may receive counseling support and other resources to help you quit.  Schedule a dentist appointment. At home, brush your teeth with a soft toothbrush and be gentle when you floss. SEEK MEDICAL CARE IF:   You have dizziness.  You have mild pelvic cramps, pelvic pressure, or nagging pain in the abdominal area.  You have persistent nausea, vomiting, or diarrhea.  You have a bad smelling vaginal discharge.  You have pain with urination.  You notice increased swelling in your face, hands, legs, or ankles. SEEK IMMEDIATE MEDICAL CARE IF:   You have a fever.  You are leaking fluid from your vagina.  You have spotting or bleeding from your vagina.  You have severe abdominal cramping or pain.  You have rapid weight gain or loss.  You vomit blood or material that looks like coffee grounds.  You are exposed to German measles and have never had them.  You are exposed to fifth disease or chickenpox.  You develop a severe headache.  You have shortness of breath.  You have any kind of trauma, such as from a fall or a car accident.   This information is not intended to replace advice given to you by your health care provider. Make sure you discuss any questions you have with your health care provider.   Document Released: 09/22/2001 Document Revised: 10/19/2014 Document Reviewed: 08/08/2013 [employer] Interactive Patient Education 2016 [employer]  

## 2016-02-07 NOTE — MAU Provider Note (Signed)
Chief Complaint: Abdominal Pain; Back Pain; and Headache   First Provider Initiated Contact with Patient 02/07/16 1530      SUBJECTIVE HPI: Evelyn Cox is a 25 y.o. G3P2002 at 2656w2d by LMP who presents to maternity admissions reporting abdominal cramping x 2 weeks and positive pregnancy test at home.  She reports occasional h/a and mild backaches daily.  She also reports white vaginal discharge.  She has not tried any treatments for her symptoms, nothing makes them better or worse.   She denies vaginal bleeding, vaginal itching/burning, urinary symptoms, h/a, dizziness, or fever/chills.     HPI  History reviewed. No pertinent past medical history. History reviewed. No pertinent past surgical history. Social History   Social History  . Marital Status: Single    Spouse Name: N/A  . Number of Children: N/A  . Years of Education: N/A   Occupational History  . Not on file.   Social History Main Topics  . Smoking status: Never Smoker   . Smokeless tobacco: Never Used  . Alcohol Use: 1.2 oz/week    2 Standard drinks or equivalent per week  . Drug Use: No  . Sexual Activity: Yes    Birth Control/ Protection: Pill   Other Topics Concern  . Not on file   Social History Narrative   No current facility-administered medications on file prior to encounter.   Current Outpatient Prescriptions on File Prior to Encounter  Medication Sig Dispense Refill  . albuterol (PROVENTIL HFA;VENTOLIN HFA) 108 (90 Base) MCG/ACT inhaler Inhale 1-2 puffs into the lungs every 6 (six) hours as needed for wheezing or shortness of breath. 1 Inhaler 0  . oxymetazoline (AFRIN NASAL SPRAY) 0.05 % nasal spray Place 1 spray into both nostrils 2 (two) times daily. (Patient not taking: Reported on 02/07/2016) 30 mL 0   No Known Allergies  ROS:  Review of Systems  Constitutional: Negative for fever, chills and fatigue.  Respiratory: Negative for shortness of breath.   Cardiovascular: Negative for chest  pain.  Genitourinary: Positive for vaginal discharge and pelvic pain. Negative for dysuria, flank pain, vaginal bleeding, difficulty urinating and vaginal pain.  Neurological: Negative for dizziness and headaches.  Psychiatric/Behavioral: Negative.      I have reviewed patient's Past Medical Hx, Surgical Hx, Family Hx, Social Hx, medications and allergies.   Physical Exam  Patient Vitals for the past 24 hrs:  BP Temp Temp src Pulse Resp Height Weight  02/07/16 1441 114/76 mmHg 97.9 F (36.6 C) Oral 82 16 5' 6.5" (1.689 m) 108 lb 12 oz (49.329 kg)   Constitutional: Well-developed, well-nourished female in no acute distress.  Cardiovascular: normal rate Respiratory: normal effort GI: Abd soft, non-tender. Pos BS x 4 MS: Extremities nontender, no edema, normal ROM Neurologic: Alert and oriented x 4.  GU: Neg CVAT.  PELVIC EXAM: Cervix pink, visually closed, without lesion, scant white creamy discharge, vaginal walls and external genitalia normal Bimanual exam: Cervix 0/long/high, firm, anterior, neg CMT, uterus nontender, nonenlarged, adnexa without tenderness, enlargement, or mass   LAB RESULTS Results for orders placed or performed during the hospital encounter of 02/07/16 (from the past 24 hour(s))  Urinalysis, Routine w reflex microscopic (not at [facility])     Status: Abnormal   Collection Time: 02/07/16  2:45 PM  Result Value Ref Range   Color, Urine YELLOW YELLOW   APPearance HAZY (A) CLEAR   Specific Gravity, Urine >1.030 (H) 1.005 - 1.030   pH 6.0 5.0 - 8.0   Glucose, UA  NEGATIVE NEGATIVE mg/dL   Hgb urine dipstick NEGATIVE NEGATIVE   Bilirubin Urine NEGATIVE NEGATIVE   Ketones, ur NEGATIVE NEGATIVE mg/dL   Protein, ur NEGATIVE NEGATIVE mg/dL   Nitrite NEGATIVE NEGATIVE   Leukocytes, UA NEGATIVE NEGATIVE  Pregnancy, urine POC     Status: Abnormal   Collection Time: 02/07/16  3:07 PM  Result Value Ref Range   Preg Test, Ur POSITIVE (A) NEGATIVE  Wet prep, genital      Status: Abnormal   Collection Time: 02/07/16  3:51 PM  Result Value Ref Range   Yeast Wet Prep HPF POC NONE SEEN NONE SEEN   Trich, Wet Prep NONE SEEN NONE SEEN   Clue Cells Wet Prep HPF POC PRESENT (A) NONE SEEN   WBC, Wet Prep HPF POC FEW (A) NONE SEEN   Sperm NONE SEEN   CBC     Status: Abnormal   Collection Time: 02/07/16  3:53 PM  Result Value Ref Range   WBC 6.0 4.0 - 10.5 K/uL   RBC 4.12 3.87 - 5.11 MIL/uL   Hemoglobin 11.4 (L) 12.0 - 15.0 g/dL   HCT 40.9 (L) 81.1 - 91.4 %   MCV 83.7 78.0 - 100.0 fL   MCH 27.7 26.0 - 34.0 pg   MCHC 33.0 30.0 - 36.0 g/dL   RDW 78.2 95.6 - 21.3 %   Platelets 273 150 - 400 K/uL  hCG, quantitative, pregnancy     Status: Abnormal   Collection Time: 02/07/16  3:53 PM  Result Value Ref Range   hCG, Beta Chain, Quant, S 08657 (H) <5 mIU/mL  ABO/Rh     Status: None (Preliminary result)   Collection Time: 02/07/16  3:53 PM  Result Value Ref Range   ABO/RH(D) O POS     --/--/O POS (04/28 1553)  IMAGING US Ob Comp Less 14 Wks  02/07/2016  CLINICAL DATA:  Abdominal pain and back pain for 2 weeks. Cramping. No bleeding. EXAM: OBSTETRIC <14 WK [place] AND TRANSVAGINAL OB US TECHNIQUE: Both transabdominal and transvaginal ultrasound examinations were performed for complete evaluation of the gestation as well as the maternal uterus, adnexal regions, and pelvic cul-de-sac. Transvaginal technique was performed to assess early pregnancy. COMPARISON:  None. FINDINGS: Intrauterine gestational sac: Present Yolk sac:  Present Embryo:  Not seen Cardiac Activity: Not seen Heart Rate: Not applicable MSD: 11  mm   5 w   6  d CRL:    mm    w    d                  [place] EDC: Subchorionic hemorrhage:  None visualized. Maternal uterus/adnexae: Maternal uterus is otherwise unremarkable. Maternal ovaries appear normal and there is no mass or free fluid identified within either adnexal region. Probable small corpus luteum within the right ovary measuring 2.5 x 1.7 cm. IMPRESSION: 1.  Intrauterine gestational sac which contains a normal-appearing yolk sac. No embryo identified as yet, likely related to the early gestational age. Recommend follow-up quantitative B-HCG levels and follow-up US in 14 days to confirm and assess viability. This recommendation follows SRU consensus guidelines: Diagnostic Criteria for Nonviable Pregnancy Early in the First Trimester. Malva Limes Med 2013; 846:9629-52. 2. Maternal ovaries are unremarkable and there is no mass or free fluid appreciated within either adnexal region. Probable small corpus luteum within the right ovary. Electronically Signed   By: Bary Richard M.D.   On: 02/07/2016 16:46   US Ob Transvaginal  02/07/2016  CLINICAL  DATA:  Abdominal pain and back pain for 2 weeks. Cramping. No bleeding. EXAM: OBSTETRIC <14 WK [place] AND TRANSVAGINAL OB US TECHNIQUE: Both transabdominal and transvaginal ultrasound examinations were performed for complete evaluation of the gestation as well as the maternal uterus, adnexal regions, and pelvic cul-de-sac. Transvaginal technique was performed to assess early pregnancy. COMPARISON:  None. FINDINGS: Intrauterine gestational sac: Present Yolk sac:  Present Embryo:  Not seen Cardiac Activity: Not seen Heart Rate: Not applicable MSD: 11  mm   5 w   6  d CRL:    mm    w    d                  [place] EDC: Subchorionic hemorrhage:  None visualized. Maternal uterus/adnexae: Maternal uterus is otherwise unremarkable. Maternal ovaries appear normal and there is no mass or free fluid identified within either adnexal region. Probable small corpus luteum within the right ovary measuring 2.5 x 1.7 cm. IMPRESSION: 1. Intrauterine gestational sac which contains a normal-appearing yolk sac. No embryo identified as yet, likely related to the early gestational age. Recommend follow-up quantitative B-HCG levels and follow-up US in 14 days to confirm and assess viability. This recommendation follows SRU consensus guidelines: Diagnostic Criteria  for Nonviable Pregnancy Early in the First Trimester. Malva Limes Med 2013; 161:0960-45. 2. Maternal ovaries are unremarkable and there is no mass or free fluid appreciated within either adnexal region. Probable small corpus luteum within the right ovary. Electronically Signed   By: Bary Richard M.D.   On: 02/07/2016 16:46    MAU Management/MDM: Ordered labs and reviewed results.  IUP on [place] today.  Outpatient [place] for viability ordered, pt to f/u with early prenatal care with [facility] or [facility].  Pt stable at time of discharge.  ASSESSMENT 1. Normal IUP (intrauterine pregnancy) on prenatal ultrasound, first trimester   2. Pelvic pain affecting pregnancy in first trimester, antepartum     PLAN Discharge home with early pregnancy precautions Pt encouraged to increase PO fluids      Follow-up Information    Please follow up.   Why:  Start prenatal care as soon as possible, Return to MAU as needed for emergencies      Sharen Counter Certified Nurse-Midwife 02/07/2016  5:31 PM

## 2016-02-08 LAB — HIV ANTIBODY (ROUTINE TESTING W REFLEX): HIV Screen 4th Generation wRfx: NONREACTIVE

## 2016-02-10 LAB — GC/CHLAMYDIA PROBE AMP (~~LOC~~) NOT AT ARMC
Chlamydia: NEGATIVE
NEISSERIA GONORRHEA: NEGATIVE

## 2016-02-17 ENCOUNTER — Ambulatory Visit (HOSPITAL_COMMUNITY): Payer: BLUE CROSS/BLUE SHIELD

## 2016-02-21 ENCOUNTER — Encounter: Payer: Self-pay | Admitting: Obstetrics & Gynecology

## 2016-02-21 ENCOUNTER — Ambulatory Visit (HOSPITAL_COMMUNITY)
Admission: RE | Admit: 2016-02-21 | Discharge: 2016-02-21 | Disposition: A | Discharge status: Home / Self Care | Payer: BLUE CROSS/BLUE SHIELD | Source: Ambulatory Visit | Attending: Advanced Practice Midwife | Admitting: Advanced Practice Midwife

## 2016-02-21 ENCOUNTER — Ambulatory Visit (INDEPENDENT_AMBULATORY_CARE_PROVIDER_SITE_OTHER): Payer: BLUE CROSS/BLUE SHIELD | Admitting: Obstetrics & Gynecology

## 2016-02-21 DIAGNOSIS — R102 Pelvic and perineal pain: Secondary | ICD-10-CM | POA: Diagnosis not present

## 2016-02-21 DIAGNOSIS — O200 Threatened abortion: Secondary | ICD-10-CM

## 2016-02-21 DIAGNOSIS — O26891 Other specified pregnancy related conditions, first trimester: Secondary | ICD-10-CM | POA: Diagnosis present

## 2016-02-21 DIAGNOSIS — O209 Hemorrhage in early pregnancy, unspecified: Secondary | ICD-10-CM

## 2016-02-21 DIAGNOSIS — Z3A01 Less than 8 weeks gestation of pregnancy: Secondary | ICD-10-CM | POA: Insufficient documentation

## 2016-02-21 DIAGNOSIS — Z3491 Encounter for supervision of normal pregnancy, unspecified, first trimester: Secondary | ICD-10-CM

## 2016-02-21 NOTE — Progress Notes (Signed)
First Trimester Screen scheduled June 19th @ 0900.  Pt notified.

## 2016-02-21 NOTE — Patient Instructions (Signed)
First Trimester of Pregnancy The first trimester of pregnancy is from week 1 until the end of week 12 (months 1 through 3). A week after a sperm fertilizes an egg, the egg will implant on the wall of the uterus. This embryo will begin to develop into a baby. Genes from you and your partner are forming the baby. The female genes determine whether the baby is a boy or a girl. At 6-8 weeks, the eyes and face are formed, and the heartbeat can be seen on ultrasound. At the end of 12 weeks, all the baby's organs are formed.  Now that you are pregnant, you will want to do everything you can to have a healthy baby. Two of the most important things are to get good prenatal care and to follow your health care provider's instructions. Prenatal care is all the medical care you receive before the baby's birth. This care will help prevent, find, and treat any problems during the pregnancy and childbirth. BODY CHANGES Your body goes through many changes during pregnancy. The changes vary from woman to woman.   You may gain or lose a couple of pounds at first.  You may feel sick to your stomach (nauseous) and throw up (vomit). If the vomiting is uncontrollable, call your health care provider.  You may tire easily.  You may develop headaches that can be relieved by medicines approved by your health care provider.  You may urinate more often. Painful urination may mean you have a bladder infection.  You may develop heartburn as a result of your pregnancy.  You may develop constipation because certain hormones are causing the muscles that push waste through your intestines to slow down.  You may develop hemorrhoids or swollen, bulging veins (varicose veins).  Your breasts may begin to grow larger and become tender. Your nipples may stick out more, and the tissue that surrounds them (areola) may become darker.  Your gums may bleed and may be sensitive to brushing and flossing.  Dark spots or blotches (chloasma,  mask of pregnancy) may develop on your face. This will likely fade after the baby is born.  Your menstrual periods will stop.  You may have a loss of appetite.  You may develop cravings for certain kinds of food.  You may have changes in your emotions from day to day, such as being excited to be pregnant or being concerned that something may go wrong with the pregnancy and baby.  You may have more vivid and strange dreams.  You may have changes in your hair. These can include thickening of your hair, rapid growth, and changes in texture. Some women also have hair loss during or after pregnancy, or hair that feels dry or thin. Your hair will most likely return to normal after your baby is born. WHAT TO EXPECT AT YOUR PRENATAL VISITS During a routine prenatal visit:  You will be weighed to make sure you and the baby are growing normally.  Your blood pressure will be taken.  Your abdomen will be measured to track your baby's growth.  The fetal heartbeat will be listened to starting around week 10 or 12 of your pregnancy.  Test results from any previous visits will be discussed. Your health care provider may ask you:  How you are feeling.  If you are feeling the baby move.  If you have had any abnormal symptoms, such as leaking fluid, bleeding, severe headaches, or abdominal cramping.  If you are using any tobacco products,   including cigarettes, chewing tobacco, and electronic cigarettes.  If you have any questions. Other tests that may be performed during your first trimester include:  Blood tests to find your blood type and to check for the presence of any previous infections. They will also be used to check for low iron levels (anemia) and Rh antibodies. Later in the pregnancy, blood tests for diabetes will be done along with other tests if problems develop.  Urine tests to check for infections, diabetes, or protein in the urine.  An ultrasound to confirm the proper growth  and development of the baby.  An amniocentesis to check for possible genetic problems.  Fetal screens for spina bifida and Down syndrome.  You may need other tests to make sure you and the baby are doing well.  HIV (human immunodeficiency virus) testing. Routine prenatal testing includes screening for HIV, unless you choose not to have this test. HOME CARE INSTRUCTIONS  Medicines  Follow your health care provider's instructions regarding medicine use. Specific medicines may be either safe or unsafe to take during pregnancy.  Take your prenatal vitamins as directed.  If you develop constipation, try taking a stool softener if your health care provider approves. Diet  Eat regular, well-balanced meals. Choose a variety of foods, such as meat or vegetable-based protein, fish, milk and low-fat dairy products, vegetables, fruits, and whole grain breads and cereals. Your health care provider will help you determine the amount of weight gain that is right for you.  Avoid raw meat and uncooked cheese. These carry germs that can cause birth defects in the baby.  Eating four or five small meals rather than three large meals a day may help relieve nausea and vomiting. If you start to feel nauseous, eating a few soda crackers can be helpful. Drinking liquids between meals instead of during meals also seems to help nausea and vomiting.  If you develop constipation, eat more high-fiber foods, such as fresh vegetables or fruit and whole grains. Drink enough fluids to keep your urine clear or pale yellow. Activity and Exercise  Exercise only as directed by your health care provider. Exercising will help you:  Control your weight.  Stay in shape.  Be prepared for labor and delivery.  Experiencing pain or cramping in the lower abdomen or low back is a good sign that you should stop exercising. Check with your health care provider before continuing normal exercises.  Try to avoid standing for long  periods of time. Move your legs often if you must stand in one place for a long time.  Avoid heavy lifting.  Wear low-heeled shoes, and practice good posture.  You may continue to have sex unless your health care provider directs you otherwise. Relief of Pain or Discomfort  Wear a good support bra for breast tenderness.   Take warm sitz baths to soothe any pain or discomfort caused by hemorrhoids. Use hemorrhoid cream if your health care provider approves.   Rest with your legs elevated if you have leg cramps or low back pain.  If you develop varicose veins in your legs, wear support hose. Elevate your feet for 15 minutes, 3-4 times a day. Limit salt in your diet. Prenatal Care  Schedule your prenatal visits by the twelfth week of pregnancy. They are usually scheduled monthly at first, then more often in the last 2 months before delivery.  Write down your questions. Take them to your prenatal visits.  Keep all your prenatal visits as directed by your   health care provider. Safety  Wear your seat belt at all times when driving.  Make a list of emergency phone numbers, including numbers for family, friends, the hospital, and police and fire departments. General Tips  Ask your health care provider for a referral to a local prenatal education class. Begin classes no later than at the beginning of month 6 of your pregnancy.  Ask for help if you have counseling or nutritional needs during pregnancy. Your health care provider can offer advice or refer you to specialists for help with various needs.  Do not use hot tubs, steam rooms, or saunas.  Do not douche or use tampons or scented sanitary pads.  Do not cross your legs for long periods of time.  Avoid cat litter boxes and soil used by cats. These carry germs that can cause birth defects in the baby and possibly loss of the fetus by miscarriage or stillbirth.  Avoid all smoking, herbs, alcohol, and medicines not prescribed by  your health care provider. Chemicals in these affect the formation and growth of the baby.  Do not use any tobacco products, including cigarettes, chewing tobacco, and electronic cigarettes. If you need help quitting, ask your health care provider. You may receive counseling support and other resources to help you quit.  Schedule a dentist appointment. At home, brush your teeth with a soft toothbrush and be gentle when you floss. SEEK MEDICAL CARE IF:   You have dizziness.  You have mild pelvic cramps, pelvic pressure, or nagging pain in the abdominal area.  You have persistent nausea, vomiting, or diarrhea.  You have a bad smelling vaginal discharge.  You have pain with urination.  You notice increased swelling in your face, hands, legs, or ankles. SEEK IMMEDIATE MEDICAL CARE IF:   You have a fever.  You are leaking fluid from your vagina.  You have spotting or bleeding from your vagina.  You have severe abdominal cramping or pain.  You have rapid weight gain or loss.  You vomit blood or material that looks like coffee grounds.  You are exposed to German measles and have never had them.  You are exposed to fifth disease or chickenpox.  You develop a severe headache.  You have shortness of breath.  You have any kind of trauma, such as from a fall or a car accident.   This information is not intended to replace advice given to you by your health care provider. Make sure you discuss any questions you have with your health care provider.   Document Released: 09/22/2001 Document Revised: 10/19/2014 Document Reviewed: 08/08/2013 Elsevier Interactive Patient Education 2016 Elsevier Inc.  

## 2016-02-22 ENCOUNTER — Encounter: Payer: Self-pay | Admitting: Obstetrics & Gynecology

## 2016-02-22 DIAGNOSIS — O209 Hemorrhage in early pregnancy, unspecified: Secondary | ICD-10-CM | POA: Insufficient documentation

## 2016-02-22 NOTE — Progress Notes (Signed)
History:  25 y.o. Evelyn Cox here today for f/u of sono.  She had bleeding in the first trimester and was seen in the MAU.  No bleeding noted since that visit.  Pt denies problems currently.   The following portions of the patient's history were reviewed and updated as appropriate: allergies, current medications, past family history, past medical history, past social history, past surgical history and problem list.  Review of Systems:  Pertinent items are noted in HPI.  Objective:  Physical Exam Last menstrual period 01/01/2016. Gen: NAD Exam: deferred  Labs and Imaging US Ob Comp Less 14 Wks  02/07/2016  CLINICAL DATA:  Abdominal pain and back pain for 2 weeks. Cramping. No bleeding. EXAM: OBSTETRIC <14 WK [place] AND TRANSVAGINAL OB US TECHNIQUE: Both transabdominal and transvaginal ultrasound examinations were performed for complete evaluation of the gestation as well as the maternal uterus, adnexal regions, and pelvic cul-de-sac. Transvaginal technique was performed to assess early pregnancy. COMPARISON:  None. FINDINGS: Intrauterine gestational sac: Present Yolk sac:  Present Embryo:  Not seen Cardiac Activity: Not seen Heart Rate: Not applicable MSD: 11  mm   5 w   6  d CRL:    mm    w    d                  [place] EDC: Subchorionic hemorrhage:  None visualized. Maternal uterus/adnexae: Maternal uterus is otherwise unremarkable. Maternal ovaries appear normal and there is no mass or free fluid identified within either adnexal region. Probable small corpus luteum within the right ovary measuring 2.5 x 1.7 cm. IMPRESSION: 1. Intrauterine gestational sac which contains a normal-appearing yolk sac. No embryo identified as yet, likely related to the early gestational age. Recommend follow-up quantitative B-HCG levels and follow-up US in 14 days to confirm and assess viability. This recommendation follows SRU consensus guidelines: Diagnostic Criteria for Nonviable Pregnancy Early in the First Trimester. Malva Limes  Med 2013; 454:0981-19. 2. Maternal ovaries are unremarkable and there is no mass or free fluid appreciated within either adnexal region. Probable small corpus luteum within the right ovary. Electronically Signed   By: Bary Richard M.D.   On: 02/07/2016 16:46   US Ob Transvaginal  02/21/2016  CLINICAL DATA:  Pelvic pain in first-trimester pregnancy. EXAM: TRANSVAGINAL OB ULTRASOUND TECHNIQUE: Transvaginal ultrasound was performed for complete evaluation of the gestation as well as the maternal uterus, adnexal regions, and pelvic cul-de-sac. COMPARISON:  None. FINDINGS: Intrauterine gestational sac: Present Yolk sac:  Present Embryo:  Present Cardiac Activity: Present Heart Rate: 144 bpm CRL:   12.7  mm   7 w 4 d                  [place] EDC: 10/05/2016 Subchorionic hemorrhage:  None visualized. Maternal uterus/adnexae: Normal appearance of the ovaries. IMPRESSION: Single living intrauterine pregnancy measuring 7 weeks 4 days. No pathologic finding. Electronically Signed   By: Marnee Spring M.D.   On: 02/21/2016 11:29   US Ob Transvaginal  02/07/2016  CLINICAL DATA:  Abdominal pain and back pain for 2 weeks. Cramping. No bleeding. EXAM: OBSTETRIC <14 WK [place] AND TRANSVAGINAL OB US TECHNIQUE: Both transabdominal and transvaginal ultrasound examinations were performed for complete evaluation of the gestation as well as the maternal uterus, adnexal regions, and pelvic cul-de-sac. Transvaginal technique was performed to assess early pregnancy. COMPARISON:  None. FINDINGS: Intrauterine gestational sac: Present Yolk sac:  Present Embryo:  Not seen Cardiac Activity: Not seen Heart Rate: Not  applicable MSD: 11  mm   5 w   6  d CRL:    mm    w    d                  US EDC: Subchorionic hemorrhage:  None visualized. Maternal uterus/adnexae: Maternal uterus is otherwise unremarkable. Maternal ovaries appear normal and there is no mass or free fluid identified within either adnexal region. Probable small corpus luteum within  the right ovary measuring 2.5 x 1.7 cm. IMPRESSION: 1. Intrauterine gestational sac which contains a normal-appearing yolk sac. No embryo identified as yet, likely related to the early gestational age. Recommend follow-up quantitative B-HCG levels and follow-up US in 14 days to confirm and assess viability. This recommendation follows SRU consensus guidelines: Diagnostic Criteria for Nonviable Pregnancy Early in the First Trimester. Malva Limes Engl J Med 2013; 161:0960-45; 369:1443-51. 2. Maternal ovaries are unremarkable and there is no mass or free fluid appreciated within either adnexal region. Probable small corpus luteum within the right ovary. Electronically Signed   By: Bary RichardStan  Maynard M.D.   On: 02/07/2016 16:46    Assessment & Plan:  Viable 1st trimester IUP Obtain PNL today PNV daily  Pt to f/u for NOB visit.  Pt wants to f/u in the [facility] of[facility]  [name], M.D., [facility]AC[facility]

## 2016-02-24 LAB — PRENATAL PROFILE (SOLSTAS)
ANTIBODY SCREEN: NEGATIVE
Basophils Absolute: 0 cells/uL (ref 0–200)
Basophils Relative: 0 %
EOS PCT: 5 %
Eosinophils Absolute: 300 cells/uL (ref 15–500)
HEMATOCRIT: 35.9 % (ref 35.0–45.0)
HEMOGLOBIN: 11.7 g/dL (ref 11.7–15.5)
HIV 1&2 Ab, 4th Generation: NONREACTIVE
Hepatitis B Surface Ag: NEGATIVE
LYMPHS PCT: 32 %
Lymphs Abs: 1920 cells/uL (ref 850–3900)
MCH: 28.1 pg (ref 27.0–33.0)
MCHC: 32.6 g/dL (ref 32.0–36.0)
MCV: 86.3 fL (ref 80.0–100.0)
MPV: 8.4 fL (ref 7.5–12.5)
Monocytes Absolute: 420 cells/uL (ref 200–950)
Monocytes Relative: 7 %
NEUTROS PCT: 56 %
Neutro Abs: 3360 cells/uL (ref 1500–7800)
Platelets: 240 10*3/uL (ref 140–400)
RBC: 4.16 MIL/uL (ref 3.80–5.10)
RDW: 13.5 % (ref 11.0–15.0)
RH TYPE: POSITIVE
Rubella: 1.36 Index — ABNORMAL HIGH (ref <0.90)
WBC: 6 10*3/uL (ref 3.8–10.8)

## 2016-02-25 LAB — HEMOGLOBINOPATHY EVALUATION
HEMOGLOBIN OTHER: 0 %
HGB A2 QUANT: 2.7 % (ref 2.2–3.2)
HGB F QUANT: 0 % (ref 0.0–2.0)
Hgb A: 97.3 % (ref 96.8–97.8)
Hgb S Quant: 0 %

## 2016-02-26 ENCOUNTER — Encounter: Payer: Self-pay | Admitting: Obstetrics & Gynecology

## 2016-02-26 DIAGNOSIS — Z3401 Encounter for supervision of normal first pregnancy, first trimester: Secondary | ICD-10-CM | POA: Insufficient documentation

## 2016-03-17 ENCOUNTER — Encounter (HOSPITAL_COMMUNITY): Payer: Self-pay | Admitting: Obstetrics & Gynecology

## 2016-03-25 ENCOUNTER — Encounter: Payer: BLUE CROSS/BLUE SHIELD | Admitting: Obstetrics & Gynecology

## 2016-03-30 ENCOUNTER — Ambulatory Visit (HOSPITAL_COMMUNITY): Admission: RE | Admit: 2016-03-30 | Payer: BLUE CROSS/BLUE SHIELD | Source: Ambulatory Visit

## 2016-03-30 ENCOUNTER — Ambulatory Visit (HOSPITAL_COMMUNITY): Payer: BLUE CROSS/BLUE SHIELD

## 2016-03-31 ENCOUNTER — Ambulatory Visit (HOSPITAL_COMMUNITY)
Admission: RE | Admit: 2016-03-31 | Discharge: 2016-03-31 | Disposition: A | Discharge status: Home / Self Care | Payer: BLUE CROSS/BLUE SHIELD | Source: Ambulatory Visit | Attending: Obstetrics & Gynecology | Admitting: Obstetrics & Gynecology

## 2016-03-31 ENCOUNTER — Encounter (HOSPITAL_COMMUNITY): Payer: Self-pay

## 2016-03-31 ENCOUNTER — Other Ambulatory Visit: Payer: Self-pay | Admitting: Obstetrics & Gynecology

## 2016-03-31 DIAGNOSIS — O09512 Supervision of elderly primigravida, second trimester: Secondary | ICD-10-CM

## 2016-03-31 DIAGNOSIS — Z3A17 17 weeks gestation of pregnancy: Secondary | ICD-10-CM

## 2016-03-31 DIAGNOSIS — Z3A12 12 weeks gestation of pregnancy: Secondary | ICD-10-CM | POA: Insufficient documentation

## 2016-03-31 DIAGNOSIS — Z1389 Encounter for screening for other disorder: Secondary | ICD-10-CM

## 2016-03-31 DIAGNOSIS — O368390 Maternal care for abnormalities of the fetal heart rate or rhythm, unspecified trimester, not applicable or unspecified: Secondary | ICD-10-CM

## 2016-03-31 DIAGNOSIS — O99332 Smoking (tobacco) complicating pregnancy, second trimester: Secondary | ICD-10-CM

## 2016-03-31 DIAGNOSIS — Z3491 Encounter for supervision of normal pregnancy, unspecified, first trimester: Secondary | ICD-10-CM

## 2016-03-31 DIAGNOSIS — Z36 Encounter for antenatal screening of mother: Secondary | ICD-10-CM | POA: Insufficient documentation

## 2016-04-02 ENCOUNTER — Encounter: Payer: Self-pay | Admitting: Obstetrics & Gynecology

## 2016-04-02 ENCOUNTER — Ambulatory Visit (INDEPENDENT_AMBULATORY_CARE_PROVIDER_SITE_OTHER): Payer: BLUE CROSS/BLUE SHIELD | Admitting: Obstetrics & Gynecology

## 2016-04-02 VITALS — BP 114/66 | HR 72 | Wt 107.0 lb

## 2016-04-02 DIAGNOSIS — Z113 Encounter for screening for infections with a predominantly sexual mode of transmission: Secondary | ICD-10-CM | POA: Diagnosis not present

## 2016-04-02 DIAGNOSIS — Z3402 Encounter for supervision of normal first pregnancy, second trimester: Secondary | ICD-10-CM

## 2016-04-02 DIAGNOSIS — Z124 Encounter for screening for malignant neoplasm of cervix: Secondary | ICD-10-CM

## 2016-04-02 DIAGNOSIS — N63 Unspecified lump in breast: Secondary | ICD-10-CM

## 2016-04-02 DIAGNOSIS — Z36 Encounter for antenatal screening of mother: Secondary | ICD-10-CM | POA: Diagnosis not present

## 2016-04-02 DIAGNOSIS — Z3490 Encounter for supervision of normal pregnancy, unspecified, unspecified trimester: Secondary | ICD-10-CM

## 2016-04-02 DIAGNOSIS — Z3401 Encounter for supervision of normal first pregnancy, first trimester: Secondary | ICD-10-CM

## 2016-04-02 DIAGNOSIS — N6310 Unspecified lump in the right breast, unspecified quadrant: Secondary | ICD-10-CM

## 2016-04-02 NOTE — Progress Notes (Signed)
Subjective:    Evelyn Cox is being seen today for her first obstetrical visit.  This is not a planned pregnancy. She is at 5138w1d gestation. Her obstetrical history is significant for none. Relationship with FOB: significant other, living together. Patient undecided intend to breast feed. Pregnancy history fully reviewed.  Menstrual History: OB History    Gravida Para Term Preterm AB TAB SAB Ectopic Multiple Living   3 2 2       3        Patient's last menstrual period was 01/01/2016 (approximate).    The following portions of the patient's history were reviewed and updated as appropriate: allergies, current medications, past family history, past medical history, past social history, past surgical history and problem list.  Review of Systems H/o of a breast mass on right side.  Prev eval.  Dx'd with fibroadenoma.  Pt was to have f/u imaging in 6 months.   Objective:  BP 114/66 mmHg  Pulse 72  Wt 107 lb (48.535 kg)  LMP 01/01/2016 (Approximate) General Appearance:    Alert, cooperative, no distress, appears stated age  Head:    Normocephalic, without obvious abnormality, atraumatic  Eyes:    conjunctiva/corneas clear, EOM's intact, both eyes  Ears:    Normal external ear canals, both ears  Nose:   Nares normal, septum midline, mucosa normal, no drainage    or sinus tenderness  Throat:   Lips, mucosa, and tongue normal; teeth and gums normal  Neck:   Supple, symmetrical, trachea midline, no adenopathy;    thyroid:  no enlargement/tenderness/nodules  Back:     Symmetric, no curvature, ROM normal, no CVA tenderness  Lungs:     Clear to auscultation bilaterally, respirations unlabored  Chest Wall:    No tenderness or deformity   Heart:    Regular rate and rhythm, S1 and S2 normal, no murmur, rub   or gallop  Breast Exam:    No tenderness or nipple abnormality; right breast mass- ~3cm mobile; no skin chnages  Abdomen:     Soft, non-tender, bowel sounds active all four quadrants,    no masses, no organomegaly  Genitalia:    Normal female without lesion, discharge or tenderness     Extremities:   Extremities normal, atraumatic, no cyanosis or edema  Pulses:   2+ and symmetric all extremities  Skin:   Skin color, texture, turgor normal, no rashes or lesions; multiple tats: one on left upper chest and one on right wrist        Assessment:    Pregnancy at 13 and 1/7 weeks   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  Needs 6 month f/u Nausea in pregnnacy   Plan:   Prenatal vitamins. Problem list reviewed and updated. AFP3 discussed: pt has already done st trimester screen 6/20.  Needs AFP. Role of ultrasound in pregnancy discussed; fetal survey: requested. Amniocentesis discussed: not indicated. Follow up in 4 weeks. Breast sono right side- suspect fibroadenoma 60% of 40 min visit spent on counseling and coordination of care.   Reviewed steps to reduce nausea in pregnancy discussed weight gain in pregnancy  Allien Melberg L. Harraway-Smith, M.D., Evern CoreFACOG

## 2016-04-02 NOTE — Patient Instructions (Signed)
Morning Sickness Morning sickness is when you feel sick to your stomach (nauseous) during pregnancy. This nauseous feeling may or may not come with vomiting. It often occurs in the morning but can be a problem any time of day. Morning sickness is most common during the first trimester, but it may continue throughout pregnancy. While morning sickness is unpleasant, it is usually harmless unless you develop severe and continual vomiting (hyperemesis gravidarum). This condition requires more intense treatment.  CAUSES  The cause of morning sickness is not completely known but seems to be related to normal hormonal changes that occur in pregnancy. RISK FACTORS You are at greater risk if you:  Experienced nausea or vomiting before your pregnancy.  Had morning sickness during a previous pregnancy.  Are pregnant with more than one baby, such as twins. TREATMENT  Do not use any medicines (prescription, over-the-counter, or herbal) for morning sickness without first talking to your health care provider. Your health care provider may prescribe or recommend:  Vitamin B6 supplements.  Anti-nausea medicines.  The herbal medicine ginger. HOME CARE INSTRUCTIONS   Only take over-the-counter or prescription medicines as directed by your health care provider.  Taking multivitamins before getting pregnant can prevent or decrease the severity of morning sickness in most women.  Eat a piece of dry toast or unsalted crackers before getting out of bed in the morning.  Eat five or six small meals a day.  Eat dry and bland foods (rice, baked potato). Foods high in carbohydrates are often helpful.  Do not drink liquids with your meals. Drink liquids between meals.  Avoid greasy, fatty, and spicy foods.  Get someone to cook for you if the smell of any food causes nausea and vomiting.  If you feel nauseous after taking prenatal vitamins, take the vitamins at night or with a snack.  Snack on protein  foods (nuts, yogurt, cheese) between meals if you are hungry.  Eat unsweetened gelatins for desserts.  Wearing an acupressure wristband (worn for sea sickness) may be helpful.  Acupuncture may be helpful.  Do not smoke.  Get a humidifier to keep the air in your house free of odors.  Get plenty of fresh air. SEEK MEDICAL CARE IF:   Your home remedies are not working, and you need medicine.  You feel dizzy or lightheaded.  You are losing weight. SEEK IMMEDIATE MEDICAL CARE IF:   You have persistent and uncontrolled nausea and vomiting.  You pass out (faint). MAKE SURE YOU:  Understand these instructions.  Will watch your condition.  Will get help right away if you are not doing well or get worse.   This information is not intended to replace advice given to you by your health care provider. Make sure you discuss any questions you have with your health care provider.   Document Released: 11/19/2006 Document Revised: 10/03/2013 Document Reviewed: 03/15/2013 Elsevier Interactive Patient Education 2016 [employer]  Ginger- make into tea  Vit B6 (25-50mg )+ unisom (25mg ) ok to take every 6 hours

## 2016-04-02 NOTE — Addendum Note (Signed)
Addended by: Anell BarrHOWARD, JENNIFER L on: 04/02/2016 04:52 PM   Modules accepted: Orders

## 2016-04-03 ENCOUNTER — Telehealth: Payer: Self-pay

## 2016-04-03 LAB — WET PREP BY MOLECULAR PROBE
Candida species: NEGATIVE
GARDNERELLA VAGINALIS: POSITIVE — AB
Trichomonas vaginosis: NEGATIVE

## 2016-04-03 MED ORDER — METRONIDAZOLE 0.75 % VA GEL: VAGINAL | Status: DC

## 2016-04-03 NOTE — Telephone Encounter (Signed)
Left message for patient to return call to office. Positive affirm for bacterial vaginosis and sent in RX to pharmacy. Armandina StammerJennifer Howard RN BSN

## 2016-04-04 LAB — CULTURE, URINE COMPREHENSIVE
COLONY COUNT: NO GROWTH
Organism ID, Bacteria: NO GROWTH

## 2016-04-07 LAB — CYTOLOGY - PAP

## 2016-04-07 LAB — GC/CHLAMYDIA PROBE AMP (~~LOC~~) NOT AT ARMC
Chlamydia: NEGATIVE
Neisseria Gonorrhea: NEGATIVE

## 2016-04-09 ENCOUNTER — Other Ambulatory Visit (HOSPITAL_COMMUNITY): Payer: Self-pay

## 2016-04-15 ENCOUNTER — Ambulatory Visit
Admission: RE | Admit: 2016-04-15 | Discharge: 2016-04-15 | Disposition: A | Discharge status: Home / Self Care | Payer: BLUE CROSS/BLUE SHIELD | Source: Ambulatory Visit | Attending: Obstetrics & Gynecology | Admitting: Obstetrics & Gynecology

## 2016-04-15 DIAGNOSIS — N6310 Unspecified lump in the right breast, unspecified quadrant: Secondary | ICD-10-CM

## 2016-04-27 ENCOUNTER — Ambulatory Visit (INDEPENDENT_AMBULATORY_CARE_PROVIDER_SITE_OTHER): Payer: BLUE CROSS/BLUE SHIELD | Admitting: Obstetrics & Gynecology

## 2016-04-27 VITALS — BP 103/63 | HR 71 | Wt 108.0 lb

## 2016-04-27 DIAGNOSIS — Z3401 Encounter for supervision of normal first pregnancy, first trimester: Secondary | ICD-10-CM

## 2016-04-27 DIAGNOSIS — Z36 Encounter for antenatal screening of mother: Secondary | ICD-10-CM

## 2016-04-27 DIAGNOSIS — Z3482 Encounter for supervision of other normal pregnancy, second trimester: Secondary | ICD-10-CM

## 2016-04-27 NOTE — Progress Notes (Signed)
Patient reports episode where she get a hot feeling all over, accompanied by a headache- if this happens and she doesn't sit down she passes out. Patient states that this happened with her last pregnancy as well. Patient states this usually occurs while standing at work. Armandina StammerJennifer Howard RN BSN

## 2016-04-27 NOTE — Patient Instructions (Signed)

## 2016-04-27 NOTE — Progress Notes (Signed)
Subjective:  Evelyn Cox is a 25 y.o. G3P2003 at 3624w5d being seen today for ongoing prenatal care.  She is currently monitored for the following issues for this low-risk pregnancy and has First trimester bleeding and Supervision of normal first pregnancy in first trimester on her problem list.  Patient reports feeling shaky and lightheaded around 0915 each morning.  . Pt has a high carb breakfast Contractions: Not present. Vag. Bleeding: None.  Movement: Present. Denies leaking of fluid.   The following portions of the patient's history were reviewed and updated as appropriate: allergies, current medications, past family history, past medical history, past social history, past surgical history and problem list. Problem list updated.  Objective:   Filed Vitals:   04/27/16 0825  BP: 103/63  Pulse: 71  Weight: 108 lb (48.988 kg)    Fetal Status: Fetal Heart Rate (bpm): 142   Movement: Present     General:  Alert, oriented and cooperative. Patient is in no acute distress.  Skin: Skin is warm and dry. No rash noted.   Cardiovascular: Normal heart rate noted  Respiratory: Normal respiratory effort, no problems with respiration noted  Abdomen: Soft, gravid, appropriate for gestational age. Pain/Pressure: Absent     Pelvic:  Cervical exam deferred        Extremities: Normal range of motion.  Edema: None  Mental Status: Normal mood and affect. Normal behavior. Normal judgment and thought content.   Urinalysis: Urine Protein: Negative Urine Glucose: Negative  Assessment and Plan:  Pregnancy: G3P2003 at 7224w5d  1. Supervision of normal pregnancy, antepartum, second trimester   - US MFM OB COMP + 14 WK; Future - Alpha fetoprotein, maternal Adjust diet. Rec protein rich breakfast.  Preterm labor symptoms and general obstetric precautions including but not limited to vaginal bleeding, contractions, leaking of fluid and fetal movement were reviewed in detail with the patient. Please refer  to After Visit Summary for other counseling recommendations.  Return in about 4 weeks (around 05/25/2016).   Willodean Rosenthalarolyn Harraway-Smith, MD

## 2016-04-28 LAB — ALPHA FETOPROTEIN, MATERNAL
AFP: 70.8 ng/mL
CURR GEST AGE: 16.7 wk
MOM FOR AFP: 1.7
OPEN SPINA BIFIDA: NEGATIVE
Osb Risk: 1:1630 {titer}

## 2016-05-15 ENCOUNTER — Ambulatory Visit (HOSPITAL_COMMUNITY): Payer: BLUE CROSS/BLUE SHIELD

## 2016-05-15 ENCOUNTER — Ambulatory Visit (HOSPITAL_COMMUNITY)
Admission: RE | Admit: 2016-05-15 | Discharge: 2016-05-15 | Disposition: A | Discharge status: Home / Self Care | Payer: BLUE CROSS/BLUE SHIELD | Source: Ambulatory Visit | Attending: Obstetrics & Gynecology | Admitting: Obstetrics & Gynecology

## 2016-05-15 DIAGNOSIS — Z3A19 19 weeks gestation of pregnancy: Secondary | ICD-10-CM | POA: Insufficient documentation

## 2016-05-15 DIAGNOSIS — Z3482 Encounter for supervision of other normal pregnancy, second trimester: Secondary | ICD-10-CM

## 2016-05-15 DIAGNOSIS — Z36 Encounter for antenatal screening of mother: Secondary | ICD-10-CM | POA: Diagnosis not present

## 2016-05-15 DIAGNOSIS — Z3401 Encounter for supervision of normal first pregnancy, first trimester: Secondary | ICD-10-CM

## 2016-05-21 ENCOUNTER — Ambulatory Visit (INDEPENDENT_AMBULATORY_CARE_PROVIDER_SITE_OTHER): Payer: BLUE CROSS/BLUE SHIELD | Admitting: Obstetrics & Gynecology

## 2016-05-21 DIAGNOSIS — Z3401 Encounter for supervision of normal first pregnancy, first trimester: Secondary | ICD-10-CM

## 2016-05-21 NOTE — Patient Instructions (Signed)

## 2016-05-21 NOTE — Progress Notes (Signed)
Subjective:  Evelyn Cox is a 25 y.o. G3P2003 at 7537w1d being seen today for ongoing prenatal care.  She is currently monitored for the following issues for this low-risk pregnancy and has First trimester bleeding and Supervision of normal first pregnancy in first trimester on her problem list.  Patient reports no complaints.  Contractions: Not present. Vag. Bleeding: None.  Movement: Present. Denies leaking of fluid.   The following portions of the patient's history were reviewed and updated as appropriate: allergies, current medications, past family history, past medical history, past social history, past surgical history and problem list. Problem list updated.  Objective:   Vitals:   05/21/16 1307  BP: 121/69  Pulse: 84  Weight: 116 lb (52.6 kg)    Fetal Status: Fetal Heart Rate (bpm): 150   Movement: Present     General:  Alert, oriented and cooperative. Patient is in no acute distress.  Skin: Skin is warm and dry. No rash noted.   Cardiovascular: Normal heart rate noted  Respiratory: Normal respiratory effort, no problems with respiration noted  Abdomen: Soft, gravid, appropriate for gestational age. Pain/Pressure: Absent     Pelvic:  Cervical exam deferred        Extremities: Normal range of motion.  Edema: None  Mental Status: Normal mood and affect. Normal behavior. Normal judgment and thought content.   Urinalysis: Urine Protein: Negative Urine Glucose: Negative  Assessment and Plan:  Pregnancy: G3P2003 at 2237w1d  1. Supervision of normal first pregnancy in first trimester  - US MFM OB FOLLOW UP; In 4 weeks to complete anatomy  Preterm labor symptoms and general obstetric precautions including but not limited to vaginal bleeding, contractions, leaking of fluid and fetal movement were reviewed in detail with the patient. Please refer to After Visit Summary for other counseling recommendations.  No Follow-up on file.   Willodean Rosenthalarolyn Harraway-Smith, MD

## 2016-05-25 ENCOUNTER — Encounter: Payer: BLUE CROSS/BLUE SHIELD | Admitting: Family Medicine

## 2016-06-01 ENCOUNTER — Encounter: Payer: BLUE CROSS/BLUE SHIELD | Admitting: Obstetrics & Gynecology

## 2016-06-08 ENCOUNTER — Ambulatory Visit (HOSPITAL_COMMUNITY): Payer: BLUE CROSS/BLUE SHIELD

## 2016-06-08 NOTE — Addendum Note (Signed)
Encounter addended by: Willodean Rosenthalarolyn Harraway-Smith, MD on: 06/08/2016 10:55 AM<BR>    Actions taken: Visit diagnoses modified, Problem List modified

## 2016-06-18 ENCOUNTER — Ambulatory Visit (HOSPITAL_COMMUNITY)
Admission: RE | Admit: 2016-06-18 | Discharge: 2016-06-18 | Disposition: A | Discharge status: Home / Self Care | Payer: BLUE CROSS/BLUE SHIELD | Source: Ambulatory Visit | Attending: Obstetrics & Gynecology | Admitting: Obstetrics & Gynecology

## 2016-06-18 ENCOUNTER — Ambulatory Visit (INDEPENDENT_AMBULATORY_CARE_PROVIDER_SITE_OTHER): Payer: BLUE CROSS/BLUE SHIELD | Admitting: Family Medicine

## 2016-06-18 VITALS — BP 106/69 | HR 80 | Wt 119.0 lb

## 2016-06-18 DIAGNOSIS — Z3401 Encounter for supervision of normal first pregnancy, first trimester: Secondary | ICD-10-CM

## 2016-06-18 DIAGNOSIS — Z36 Encounter for antenatal screening of mother: Secondary | ICD-10-CM | POA: Diagnosis present

## 2016-06-18 DIAGNOSIS — Z3A24 24 weeks gestation of pregnancy: Secondary | ICD-10-CM | POA: Diagnosis not present

## 2016-06-18 NOTE — Progress Notes (Signed)
   PRENATAL VISIT NOTE  Subjective:  Evelyn Cox is a 25 y.o. G3P2003 at 4467w1d being seen today for ongoing prenatal care.  She is currently monitored for the following issues for this low-risk pregnancy and has First trimester bleeding and Supervision of normal first pregnancy in first trimester on her problem list.  Patient reports no complaints.  Contractions: Not present. Vag. Bleeding: None.  Movement: Present. Denies leaking of fluid.   The following portions of the patient's history were reviewed and updated as appropriate: allergies, current medications, past family history, past medical history, past social history, past surgical history and problem list. Problem list updated.  Objective:   Vitals:   06/18/16 0842  BP: 106/69  Pulse: 80  Weight: 119 lb (54 kg)    Fetal Status: Fetal Heart Rate (bpm): 140   Movement: Present     General:  Alert, oriented and cooperative. Patient is in no acute distress.  Skin: Skin is warm and dry. No rash noted.   Cardiovascular: Normal heart rate noted  Respiratory: Normal respiratory effort, no problems with respiration noted  Abdomen: Soft, gravid, appropriate for gestational age. Pain/Pressure: Absent     Pelvic:  Cervical exam deferred        Extremities: Normal range of motion.  Edema: None  Mental Status: Normal mood and affect. Normal behavior. Normal judgment and thought content.   Urinalysis: Urine Protein: Negative Urine Glucose: Negative  Assessment and Plan:  Pregnancy: G3P2003 at 4067w1d  1. Supervision of normal first pregnancy in first trimester FH and FHT normal.  28 week labs at next visi  Preterm labor symptoms and general obstetric precautions including but not limited to vaginal bleeding, contractions, leaking of fluid and fetal movement were reviewed in detail with the patient. Please refer to After Visit Summary for other counseling recommendations.  Return in about 4 weeks (around 07/16/2016) for OB  f/u.  Levie HeritageJacob J Stinson, DO

## 2016-06-22 ENCOUNTER — Encounter (HOSPITAL_COMMUNITY): Payer: Self-pay

## 2016-06-22 ENCOUNTER — Inpatient Hospital Stay (HOSPITAL_COMMUNITY)
Admission: AD | Admit: 2016-06-22 | Discharge: 2016-06-22 | Disposition: A | Discharge status: Home / Self Care | Payer: BLUE CROSS/BLUE SHIELD | Source: Ambulatory Visit | Attending: Family Medicine | Admitting: Family Medicine

## 2016-06-22 ENCOUNTER — Telehealth: Payer: Self-pay

## 2016-06-22 DIAGNOSIS — O98312 Other infections with a predominantly sexual mode of transmission complicating pregnancy, second trimester: Secondary | ICD-10-CM | POA: Diagnosis not present

## 2016-06-22 DIAGNOSIS — O26892 Other specified pregnancy related conditions, second trimester: Secondary | ICD-10-CM

## 2016-06-22 DIAGNOSIS — R109 Unspecified abdominal pain: Secondary | ICD-10-CM

## 2016-06-22 DIAGNOSIS — Z3A24 24 weeks gestation of pregnancy: Secondary | ICD-10-CM | POA: Diagnosis not present

## 2016-06-22 DIAGNOSIS — A5901 Trichomonal vulvovaginitis: Secondary | ICD-10-CM | POA: Diagnosis not present

## 2016-06-22 DIAGNOSIS — O26899 Other specified pregnancy related conditions, unspecified trimester: Secondary | ICD-10-CM

## 2016-06-22 DIAGNOSIS — A599 Trichomoniasis, unspecified: Secondary | ICD-10-CM | POA: Insufficient documentation

## 2016-06-22 DIAGNOSIS — N898 Other specified noninflammatory disorders of vagina: Secondary | ICD-10-CM

## 2016-06-22 LAB — URINALYSIS, ROUTINE W REFLEX MICROSCOPIC
Bilirubin Urine: NEGATIVE
Glucose, UA: NEGATIVE mg/dL
Hgb urine dipstick: NEGATIVE
KETONES UR: NEGATIVE mg/dL
NITRITE: NEGATIVE
PH: 6 (ref 5.0–8.0)
Protein, ur: NEGATIVE mg/dL
SPECIFIC GRAVITY, URINE: 1.025 (ref 1.005–1.030)

## 2016-06-22 LAB — URINE MICROSCOPIC-ADD ON
Bacteria, UA: NONE SEEN
RBC / HPF: NONE SEEN RBC/hpf (ref 0–5)

## 2016-06-22 LAB — WET PREP, GENITAL
Clue Cells Wet Prep HPF POC: NONE SEEN
SPERM: NONE SEEN
YEAST WET PREP: NONE SEEN

## 2016-06-22 LAB — POCT FERN TEST: POCT Fern Test: NEGATIVE

## 2016-06-22 MED ORDER — METRONIDAZOLE 500 MG PO TABS
2000.0000 mg | ORAL_TABLET | Freq: Once | ORAL | Status: AC
  Administered 2016-06-22: 2000 mg via ORAL
  Filled 2016-06-22: qty 4

## 2016-06-22 NOTE — MAU Note (Signed)
Pt c/o braxton hicks contractions that have been getting stronger and have been more uncomfortable for the last two days. Pt states her discharge has increased since yesterday. Pt states baby is moving normally. Pt denies bleeding.

## 2016-06-22 NOTE — MAU Provider Note (Signed)
History     CSN: [id number]  Arrival date and time: 06/22/16 1706   First Provider Initiated Contact with Patient 06/22/16 1808       Chief Complaint  Patient presents with  . Abdominal Cramping  . Vaginal Discharge   HPI Evelyn Cox is a 25 y.o. G3P2002 at 1222w5d who presents with abdominal cramping & vaginal discharge. Reports abdominal cramping since Friday that was more frequent yesterday. Pain comes & goes but can't tell how frequently; less than 6 times per hour. Rates pain 8/10. Has not treated. Also reports increase in vaginal discharge since Friday. At times discharge is creamy white and then other times it is watery. Denies vaginal bleeding. Denies recent intercourse.    OB History    Gravida Para Term Preterm AB Living   3 2 2     2    SAB TAB Ectopic Multiple Live Births           1      History reviewed. No pertinent past medical history.  History reviewed. No pertinent surgical history.  Family History  Problem Relation Age of Onset  . Hypertension Mother     Social History  Substance Use Topics  . Smoking status: Never Smoker  . Smokeless tobacco: Never Used  . Alcohol use 1.2 oz/week    2 Standard drinks or equivalent per week    Allergies: No Known Allergies  Prescriptions Prior to Admission  Medication Sig Dispense Refill Last Dose  . Prenatal Vit-Fe Fum-Fe Bisg-FA (NATACHEW) 28-1 MG CHEW Chew 1 each by mouth daily.   Past Week at Unknown time  . metroNIDAZOLE (METROGEL VAGINAL) 0.75 % vaginal gel Place one applicator vaginally every night for 5 nights. (Patient not taking: Reported on 06/18/2016) 70 g 0 Completed Course at Unknown time    Review of Systems  Constitutional: Negative.   Gastrointestinal: Positive for abdominal pain. Negative for constipation, diarrhea, nausea and vomiting.  Genitourinary: Negative for dysuria.       + vaginal discharge No vaginal bleeding   Physical Exam   Blood pressure 97/68, pulse 76, temperature 98 F  (36.7 C), temperature source Oral, resp. rate 18, height 5\' 7"  (1.702 m), weight 119 lb (54 kg), last menstrual period 01/01/2016, SpO2 97 %.  Physical Exam  Nursing note and vitals reviewed. Constitutional: She is oriented to person, place, and time. She appears well-developed and well-nourished. No distress.  HENT:  Head: Normocephalic and atraumatic.  Eyes: Conjunctivae are normal. Right eye exhibits no discharge. Left eye exhibits no discharge. No scleral icterus.  Neck: Normal range of motion.  Respiratory: Effort normal. No respiratory distress.  GI: Soft. There is no tenderness.  Genitourinary: Vaginal discharge (small amount of thin white discharge; no pooling) found.  Neurological: She is alert and oriented to person, place, and time.  Skin: Skin is warm and dry. She is not diaphoretic.  Psychiatric: She has a normal mood and affect. Her behavior is normal. Judgment and thought content normal.   Dilation: Closed Effacement (%): Thick Cervical Position: Posterior Exam by:: Judeth HornErin Yerania Chamorro NP  Fetal Tracing:  Baseline: 135 Variability: moderate Accelerations: 15x15 Decelerations: none   Toco: none   MAU Course  Procedures Results for orders placed or performed during the hospital encounter of 06/22/16 (from the past 24 hour(s))  Urinalysis, Routine w reflex microscopic ([facility])     Status: Abnormal   Collection Time: 06/22/16  5:30 PM  Result Value Ref Range   Color, Urine YELLOW  YELLOW   APPearance HAZY (A) CLEAR   Specific Gravity, Urine 1.025 1.005 - 1.030   pH 6.0 5.0 - 8.0   Glucose, UA NEGATIVE NEGATIVE mg/dL   Hgb urine dipstick NEGATIVE NEGATIVE   Bilirubin Urine NEGATIVE NEGATIVE   Ketones, ur NEGATIVE NEGATIVE mg/dL   Protein, ur NEGATIVE NEGATIVE mg/dL   Nitrite NEGATIVE NEGATIVE   Leukocytes, UA TRACE (A) NEGATIVE  Urine microscopic-add on     Status: Abnormal   Collection Time: 06/22/16  5:30 PM  Result Value Ref Range   Squamous Epithelial /  LPF 0-5 (A) NONE SEEN   WBC, UA 0-5 0 - 5 WBC/hpf   RBC / HPF NONE SEEN 0 - 5 RBC/hpf   Bacteria, UA NONE SEEN NONE SEEN   Urine-Other MUCOUS PRESENT   Wet prep, genital     Status: Abnormal   Collection Time: 06/22/16  6:35 PM  Result Value Ref Range   Yeast Wet Prep HPF POC NONE SEEN NONE SEEN   Trich, Wet Prep PRESENT (A) NONE SEEN   Clue Cells Wet Prep HPF POC NONE SEEN NONE SEEN   WBC, Wet Prep HPF POC MANY (A) NONE SEEN   Sperm NONE SEEN   Fern Test     Status: None   Collection Time: 06/22/16  6:39 PM  Result Value Ref Range   POCT Fern Test Negative = intact amniotic membranes     MDM Reactive tracing Cervix closed Wet prep No pooling, fern negative Wet prep + for trich -- flagyl 2 gm PO given in MAU GC/CT collected Assessment and Plan  A: 1. Abdominal pain affecting pregnancy   2. Trichomoniasis   3. Vaginal discharge during pregnancy, antepartum, second trimester     P: Discharge home No intercourse x 1 week after both her and partner treated Expedited partner tx info & rx given GC/CT pending Discussed reasons to return to MAU Keep f/u with OB  [name] 06/22/2016, 6:07 PM

## 2016-06-22 NOTE — Telephone Encounter (Signed)
Patient called stating that several times yesterday she had what she thought were Braxton Hicks contractions. Patient states that today she started having a discharge that is similar to loosing a mucous plug. Patient denies any bleeding and states baby is moving well. Patient is 24 weeks.   Patient [facility]. Patient states understanding. Armandina StammerJennifer Keita Demarco RN BSN

## 2016-06-22 NOTE — Discharge Instructions (Signed)
Trichomoniasis Trichomoniasis is an infection caused by an organism called Trichomonas. The infection can affect both women and men. In women, the outer female genitalia and the vagina are affected. In men, the penis is mainly affected, but the prostate and other reproductive organs can also be involved. Trichomoniasis is a sexually transmitted infection (STI) and is most often passed to another person through sexual contact.  RISK FACTORS  Having unprotected sexual intercourse.  Having sexual intercourse with an infected partner. SIGNS AND SYMPTOMS  Symptoms of trichomoniasis in women include:  Abnormal gray-green frothy vaginal discharge.  Itching and irritation of the vagina.  Itching and irritation of the area outside the vagina. Symptoms of trichomoniasis in men include:   Penile discharge with or without pain.  Pain during urination. This results from inflammation of the urethra. DIAGNOSIS  Trichomoniasis may be found during a Pap test or physical exam. Your health care provider may use one of the following methods to help diagnose this infection:  Testing the pH of the vagina with a test tape.  Using a vaginal swab test that checks for the Trichomonas organism. A test is available that provides results within a few minutes.  Examining a urine sample.  Testing vaginal secretions. Your health care provider may test you for other STIs, including HIV. TREATMENT   You may be given medicine to fight the infection. Women should inform their health care provider if they could be or are pregnant. Some medicines used to treat the infection should not be taken during pregnancy.  Your health care provider may recommend over-the-counter medicines or creams to decrease itching or irritation.  Your sexual partner will need to be treated if infected.  Your health care provider may test you for infection again 3 months after treatment. HOME CARE INSTRUCTIONS   Take medicines only as  directed by your health care provider.  Take over-the-counter medicine for itching or irritation as directed by your health care provider.  Do not have sexual intercourse while you have the infection.  Women should not douche or wear tampons while they have the infection.  Discuss your infection with your partner. Your partner may have gotten the infection from you, or you may have gotten it from your partner.  Have your sex partner get examined and treated if necessary.  Practice safe, informed, and protected sex.  See your health care provider for other STI testing. SEEK MEDICAL CARE IF:   You still have symptoms after you finish your medicine.  You develop abdominal pain.  You have pain when you urinate.  You have bleeding after sexual intercourse.  You develop a rash.  Your medicine makes you sick or makes you throw up (vomit). MAKE SURE YOU:  Understand these instructions.  Will watch your condition.  Will get help right away if you are not doing well or get worse.   This information is not intended to replace advice given to you by your health care provider. Make sure you discuss any questions you have with your health care provider.   Document Released: 03/24/2001 Document Revised: 10/19/2014 Document Reviewed: 07/10/2013 Elsevier Interactive Patient Education 2016 [employer].     Preterm Labor Information Preterm labor is when labor starts before you are [redacted] weeks pregnant. The normal length of pregnancy is 39 to 41 weeks.  CAUSES  The cause of preterm labor is not often known. The most common known cause is infection. RISK FACTORS  Having a history of preterm labor.  Having your  water break before it should.  Having a placenta that covers the opening of the cervix.  Having a placenta that breaks away from the uterus.  Having a cervix that is too weak to hold the baby in the uterus.  Having too much fluid in the amniotic sac.  Taking drugs or  smoking while pregnant.  Not gaining enough weight while pregnant.  Being younger than 73 and older than 25 years old.  Having a low income.  Being African American. SYMPTOMS  Period-like cramps, belly (abdominal) pain, or back pain.  Contractions that are regular, as often as six in an hour. They may be mild or painful.  Contractions that start at the top of the belly. They then move to the lower belly and back.  Lower belly pressure that seems to get stronger.  Bleeding from the vagina.  Fluid leaking from the vagina. TREATMENT  Treatment depends on:  Your condition.  The condition of your baby.  How many weeks pregnant you are. Your doctor may have you:  Take medicine to stop contractions.  Stay in bed except to use the restroom (bed rest).  Stay in the hospital. WHAT SHOULD YOU DO IF YOU THINK YOU ARE IN PRETERM LABOR? Call your doctor right away. You need to go to the hospital right away.  HOW CAN YOU PREVENT PRETERM LABOR IN FUTURE PREGNANCIES?  Stop smoking, if you smoke.  Maintain healthy weight gain.  Do not take drugs or be around chemicals that are not needed.  Tell your doctor if you think you have an infection.  Tell your doctor if you had a preterm labor before.   This information is not intended to replace advice given to you by your health care provider. Make sure you discuss any questions you have with your health care provider.   Document Released: 12/25/2008 Document Revised: 02/12/2015 Document Reviewed: 10/31/2012 Elsevier Interactive Patient Education 2016 [employer].                  Expedited Partner Therapy:  Information Sheet for Patients and Partners               You have been offered expedited partner therapy (EPT). This information sheet contains important information and warnings you need to be aware of, so please read it carefully.   Expedited Partner Therapy (EPT) is the clinical practice of treating the  sexual partners of persons who receive chlamydia, gonorrhea, or trichomoniasis diagnoses by providing medications or prescriptions to the patient. Patients then provide partners with these therapies without the health-care provider having examined the partner. In other words, EPT is a convenient, fast and private way for patients to help their sexual partners get treated.   Chlamydia and gonorrhea are bacterial infections you get from having sex with a person who is already infected. Trichomoniasis (or trich) is a very common sexually transmitted infection (STI) that is caused by infection with a protozoan parasite called Trichomonas vaginalis.  Many people with these infections dont know it because they feel fine, but without treatment these infections can cause serious health problems, such as pelvic inflammatory disease, ectopic pregnancy, infertility and increased risk of HIV.   It is important to get treated as soon as possible to protect your health, to avoid spreading these infections to others, and to prevent yourself from becoming re-infected. The good news is these infections can be easily cured with proper antibiotic medicine. The best way to take care of your self is  to see a doctor or go to your local health department. If you are not able to see a doctor or other medical provider, you should take EPT.    Recommended Medication: EPT for Chlamydia:  Azithromycin (Zithromax) 1 gram orally in a single dose EPT for Gonorrhea:  Cefixime (Suprax) 400 milligrams orally in a single dose PLUS azithromycin (Zithromax) 1 gram orally in a single dose EPT for Trichomoniasis:  Metronidazole (Flagyl) 2 grams orally in a single dose   These medicines are very safe. However, you should not take them if you have ever had an allergic reaction (like a rash) to any of these medicines: azithromycin (Zithromax), erythromycin, clarithromycin (Biaxin), metronidazole (Flagyl), tinidazole (Tindimax). If you are  uncertain about whether you have an allergy, call your medical provider or pharmacist before taking this medicine. If you have a serious, long-term illness like kidney, liver or heart disease, colitis or stomach problems, or you are currently taking other prescription medication, talk to your provider before taking this medication.   Women: If you have lower belly pain, pain during sex, vomiting, or a fever, do not take this medicine. Instead, you should see a medical provider to be certain you do not have pelvic inflammatory disease (PID). PID can be serious and lead to infertility, pregnancy problems or chronic pelvic pain.   Pregnant Women: It is very important for you to see a doctor to get pregnancy services and pre-natal care. These antibiotics for EPT are safe for pregnant women, but you still need to see a medical provider as soon as possible. It is also important to note that Doxycycline is an alternative therapy for chlamydia, but it should not be taken by someone who is pregnant.   Men: If you have pain or swelling in the testicles or a fever, do not take this medicine and see a medical provider.     Men who have sex with men (MSM): MSM in [place] continue to experience high rates of syphilis and HIV. Many MSM with gonorrhea or chlamydia could also have syphilis and/or HIV and not know it. If you are a man who has sex with other men, it is very important that you see a medical provider and are tested for HIV and syphilis. EPT is not recommended for gonorrhea for MSM.  Recommended treatment for gonorrhea for MSM is Rocephin (shot) AND azithromycin due to decreased cure rate.  Please see your medical provider if this is the case.    Along with this information sheet is a prescription for the medicine. If you receive a prescription it will be in your name and will indicate your date of birth, or it will be in the name of Expedited Partner Therapy.   In either case, you can have the  prescription filled at a pharmacy. You will be responsible for the cost of the medicine, unless you have prescription drug coverage. In that case, you could provide your name so the pharmacy could bill your health plan.   Take the medication as directed. Some people will have a mild, upset stomach, which does not last long. AVOID alcohol 24 hours after taking metronidazole (Flagyl) to reduce the possibility of a disulfiram-like reaction (severe vomiting and abdominal pain).  After taking the medicine, do not have sex for 7 days. Do not share this medicine or give it to anyone else. It is important to tell everyone you have had sex with in the last 60 days that they need to go and  get tested for sexually transmitted infections.   Ways to prevent these and other sexually transmitted infections (STIs):    Abstain from sex. This is the only sure way to avoid getting an STI.   Use barrier methods, such as condoms, consistently and correctly.   Limit the number of sexual partners.   Have regular physical exams, including testing for STIs.   For more information about EPT or other issues pertaining to an STI, please contact your medical provider or the [facility] at [phone] or [email or web address]

## 2016-06-23 LAB — GC/CHLAMYDIA PROBE AMP (~~LOC~~) NOT AT ARMC
Chlamydia: NEGATIVE
NEISSERIA GONORRHEA: NEGATIVE

## 2016-07-15 ENCOUNTER — Encounter: Payer: BLUE CROSS/BLUE SHIELD | Admitting: Family Medicine

## 2016-07-29 ENCOUNTER — Telehealth: Payer: Self-pay

## 2016-07-29 NOTE — Telephone Encounter (Signed)
Patient called complaining at lunch she had "contractions every 8-10 minutes". Patient is [redacted] weeks pregnant and denies any bleeding or leaking of fluid. Patient states baby moving well today. [facility]. Patient states understanding but states she is feeling better in the last few minutes. Instructed patient again that my advice would be to [facility]. Armandina StammerJennifer Cason Dabney RNBSN

## 2016-07-30 ENCOUNTER — Ambulatory Visit (INDEPENDENT_AMBULATORY_CARE_PROVIDER_SITE_OTHER): Payer: BLUE CROSS/BLUE SHIELD | Admitting: Family Medicine

## 2016-07-30 VITALS — BP 116/58 | HR 87 | Wt 125.0 lb

## 2016-07-30 DIAGNOSIS — M9908 Segmental and somatic dysfunction of rib cage: Secondary | ICD-10-CM

## 2016-07-30 DIAGNOSIS — A599 Trichomoniasis, unspecified: Secondary | ICD-10-CM

## 2016-07-30 DIAGNOSIS — O98313 Other infections with a predominantly sexual mode of transmission complicating pregnancy, third trimester: Secondary | ICD-10-CM | POA: Diagnosis not present

## 2016-07-30 DIAGNOSIS — O9989 Other specified diseases and conditions complicating pregnancy, childbirth and the puerperium: Secondary | ICD-10-CM

## 2016-07-30 DIAGNOSIS — A5901 Trichomonal vulvovaginitis: Secondary | ICD-10-CM

## 2016-07-30 DIAGNOSIS — Z3403 Encounter for supervision of normal first pregnancy, third trimester: Secondary | ICD-10-CM | POA: Diagnosis not present

## 2016-07-30 DIAGNOSIS — M9903 Segmental and somatic dysfunction of lumbar region: Secondary | ICD-10-CM

## 2016-07-30 DIAGNOSIS — M9905 Segmental and somatic dysfunction of pelvic region: Secondary | ICD-10-CM | POA: Diagnosis not present

## 2016-07-30 DIAGNOSIS — M9902 Segmental and somatic dysfunction of thoracic region: Secondary | ICD-10-CM

## 2016-07-30 DIAGNOSIS — M9904 Segmental and somatic dysfunction of sacral region: Secondary | ICD-10-CM

## 2016-07-30 DIAGNOSIS — Z3401 Encounter for supervision of normal first pregnancy, first trimester: Secondary | ICD-10-CM

## 2016-07-30 LAB — CBC
HCT: 28.9 % — ABNORMAL LOW (ref 35.0–45.0)
HEMOGLOBIN: 9.5 g/dL — AB (ref 11.7–15.5)
MCH: 27.1 pg (ref 27.0–33.0)
MCHC: 32.9 g/dL (ref 32.0–36.0)
MCV: 82.3 fL (ref 80.0–100.0)
MPV: 7.8 fL (ref 7.5–12.5)
PLATELETS: 243 10*3/uL (ref 140–400)
RBC: 3.51 MIL/uL — ABNORMAL LOW (ref 3.80–5.10)
RDW: 13.8 % (ref 11.0–15.0)
WBC: 7.3 10*3/uL (ref 3.8–10.8)

## 2016-07-30 NOTE — Progress Notes (Addendum)
   PRENATAL VISIT NOTE  Subjective:  Ivor CostaBrittany Peschke is a 25 y.o. G3P2002 at 7348w1d being seen today for ongoing prenatal care.  She is currently monitored for the following issues for this low-risk pregnancy and has First trimester bleeding and Supervision of normal first pregnancy in first trimester on her problem list.  Patient reports occasional contractions. Will have contractions about q8-10 minutes, worse at night. Also having intense upper back pain on left. Intermittent.   Contractions: Irregular. Vag. Bleeding: None.  Movement: Present. Denies leaking of fluid.   The following portions of the patient's history were reviewed and updated as appropriate: allergies, current medications, past family history, past medical history, past social history, past surgical history and problem list. Problem list updated.  Objective:   Vitals:   07/30/16 0857  BP: (!) 116/58  Pulse: 87  Weight: 125 lb (56.7 kg)    Fetal Status:   Fundal Height: 30 cm Movement: Present  Presentation: Vertex  General:  Alert, oriented and cooperative. Patient is in no acute distress.  Skin: Skin is warm and dry. No rash noted.   Cardiovascular: Normal heart rate noted  Respiratory: Normal respiratory effort, no problems with respiration noted  Abdomen: Soft, gravid, appropriate for gestational age. Pain/Pressure: Present     Pelvic:  Cervical exam performed Dilation: Closed Effacement (%): Thick    Extremities: Normal range of motion.  Edema: None  MSK: Hypertension of upper back paraspinal and tenderness on left upper thoracic. Right lower lumbar tenderness.  OSE: T3 FSRL. 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  Mental Status: Normal mood and affect. Normal behavior. Normal judgment and thought content.   Assessment and Plan:  Pregnancy: G3P2002 at 4448w1d  1. Supervision of normal first pregnancy in first trimester FHT and FH normal.  - HIV antibody (with reflex) - CBC - Glucose  Tolerance, 1 HR (50g) - RPR - WET PREP BY MOLECULAR PROBE  2. Trichomonas vaginalis infection TOC today - may be cause of preterm contractions. - WET PREP BY MOLECULAR PROBE  3. Somatic dysfunction of thoracic region 4. Somatic dysfunction of lumbar region 5. Somatic dysfunction of pelvis region 6. Somatic dysfunction of sacral region 7. Somatic dysfunction of rib cage region OMT done - HVLA. Pt tolerated well.  Preterm labor symptoms and general obstetric precautions including but not limited to vaginal bleeding, contractions, leaking of fluid and fetal movement were reviewed in detail with the patient. Please refer to After Visit Summary for other counseling recommendations.  Return in about 2 weeks (around 08/13/2016) for OB f/u.  Levie HeritageJacob J Stinson, DO

## 2016-07-31 LAB — WET PREP BY MOLECULAR PROBE
CANDIDA SPECIES: NEGATIVE
GARDNERELLA VAGINALIS: NEGATIVE
Trichomonas vaginosis: NEGATIVE

## 2016-07-31 LAB — RPR

## 2016-07-31 LAB — GLUCOSE TOLERANCE, 1 HOUR (50G) W/O FASTING: GLUCOSE, 1 HR, GESTATIONAL: 95 mg/dL (ref <140)

## 2016-08-01 LAB — HIV ANTIBODY (ROUTINE TESTING W REFLEX): HIV: NONREACTIVE

## 2016-08-03 ENCOUNTER — Telehealth: Payer: Self-pay

## 2016-08-03 NOTE — Telephone Encounter (Signed)
Patient called wanting to know lab results. Patient made aware that all labs were within normal limits with the exception of her hemoglobin. Patient advise she could take a daily over the counter iron pill in addition to her prenatal vitamin. Patient states understanding. Armandina StammerJennifer Howard RNBSN

## 2016-08-04 ENCOUNTER — Encounter: Payer: Self-pay | Admitting: Family Medicine

## 2016-08-10 ENCOUNTER — Encounter: Payer: Self-pay | Admitting: Obstetrics & Gynecology

## 2016-08-12 ENCOUNTER — Ambulatory Visit (HOSPITAL_COMMUNITY): Payer: BLUE CROSS/BLUE SHIELD

## 2016-08-13 ENCOUNTER — Telehealth: Payer: Self-pay

## 2016-08-13 ENCOUNTER — Ambulatory Visit (HOSPITAL_COMMUNITY): Admission: RE | Admit: 2016-08-13 | Payer: BLUE CROSS/BLUE SHIELD | Source: Ambulatory Visit

## 2016-08-13 ENCOUNTER — Ambulatory Visit (INDEPENDENT_AMBULATORY_CARE_PROVIDER_SITE_OTHER): Payer: BLUE CROSS/BLUE SHIELD | Admitting: Obstetrics & Gynecology

## 2016-08-13 VITALS — BP 110/71 | HR 82 | Wt 127.0 lb

## 2016-08-13 DIAGNOSIS — O4703 False labor before 37 completed weeks of gestation, third trimester: Secondary | ICD-10-CM | POA: Diagnosis not present

## 2016-08-13 DIAGNOSIS — Z3403 Encounter for supervision of normal first pregnancy, third trimester: Secondary | ICD-10-CM

## 2016-08-13 DIAGNOSIS — Z3401 Encounter for supervision of normal first pregnancy, first trimester: Secondary | ICD-10-CM

## 2016-08-13 NOTE — Progress Notes (Signed)
   PRENATAL VISIT NOTE  Subjective:  Evelyn Cox is a 25 y.o. G3P2002 at 3556w1d being seen today for ongoing prenatal care.  She is currently monitored for the following issues for this low-risk pregnancy and has First trimester bleeding; Supervision of normal first pregnancy in first trimester; and Preterm uterine contractions in third trimester, antepartum on her problem list.  Patient reports had contractions  and was seen at [facility].  No further ctx noted since that visit. .  Contractions: Irregular. Vag. Bleeding: None.  Movement: Present. Denies leaking of fluid.   The following portions of the patient's history were reviewed and updated as appropriate: allergies, current medications, past family history, past medical history, past social history, past surgical history and problem list. Problem list updated.  Objective:   Vitals:   08/13/16 1042  BP: 110/71  Pulse: 82  Weight: 127 lb (57.6 kg)    Fetal Status: Fetal Heart Rate (bpm): 135 Fundal Height: 31 cm Movement: Present     General:  Alert, oriented and cooperative. Patient is in no acute distress.  Skin: Skin is warm and dry. No rash noted.   Cardiovascular: Normal heart rate noted  Respiratory: Normal respiratory effort, no problems with respiration noted  Abdomen: Soft, gravid, appropriate for gestational age. Pain/Pressure: Present     Pelvic:  Cervical exam deferred        Extremities: Normal range of motion.  Edema: None  Mental Status: Normal mood and affect. Normal behavior. Normal judgment and thought content.   Assessment and Plan:  Pregnancy: G3P2002 at 6956w1d  1. Supervision of normal first pregnancy in first trimester NST reviewed and reactive.  2. Preterm uterine contractions in third trimester, antepartum Pt was seen at [facility] 10/28 for preterm ctx.  She was observed for several hours with no cervical change   Preterm labor symptoms and general obstetric precautions including but not limited to  vaginal bleeding, contractions, leaking of fluid and fetal movement were reviewed in detail with the patient. Please refer to After Visit Summary for other counseling recommendations.  F/u in 2 weeks Willodean Rosenthalarolyn Harraway-Smith, MD

## 2016-08-13 NOTE — Telephone Encounter (Signed)
Patient called stating that she is trying to get the third in her series of Hep. B immunizations and that the clinic she is trying to get it from need a letter faxed that it is ok for her to receive this immunization during pregnancy.Armandina StammerJennifer Javell Blackburn RNBSN  Letter faxed. Armandina StammerJennifer Kassaundra Hair RNBSN

## 2016-08-27 ENCOUNTER — Ambulatory Visit (INDEPENDENT_AMBULATORY_CARE_PROVIDER_SITE_OTHER): Payer: BLUE CROSS/BLUE SHIELD | Admitting: Family Medicine

## 2016-08-27 VITALS — BP 114/76 | HR 64 | Wt 128.0 lb

## 2016-08-27 DIAGNOSIS — Z3401 Encounter for supervision of normal first pregnancy, first trimester: Secondary | ICD-10-CM

## 2016-08-27 NOTE — Progress Notes (Signed)
   PRENATAL VISIT NOTE  Subjective:  Evelyn Cox is a 25 y.o. G3P2002 at 7370w1d being seen today for ongoing prenatal care.  She is currently monitored for the following issues for this low-risk pregnancy and has First trimester bleeding; Supervision of normal first pregnancy in first trimester; and Preterm uterine contractions in third trimester, antepartum on her problem list.  Patient reports no complaints.  Contractions: Irregular. Vag. Bleeding: None.  Movement: Present. Denies leaking of fluid.   The following portions of the patient's history were reviewed and updated as appropriate: allergies, current medications, past family history, past medical history, past social history, past surgical history and problem list. Problem list updated.  Objective:   Vitals:   08/27/16 0836  BP: 114/76  Pulse: 64  Weight: 128 lb (58.1 kg)    Fetal Status: Fetal Heart Rate (bpm): 130 Fundal Height: 33 cm Movement: Present  Presentation: Vertex  General:  Alert, oriented and cooperative. Patient is in no acute distress.  Skin: Skin is warm and dry. No rash noted.   Cardiovascular: Normal heart rate noted  Respiratory: Normal respiratory effort, no problems with respiration noted  Abdomen: Soft, gravid, appropriate for gestational age. Pain/Pressure: Present     Pelvic:  Cervical exam deferred        Extremities: Normal range of motion.  Edema: None  Mental Status: Normal mood and affect. Normal behavior. Normal judgment and thought content.   Assessment and Plan:  Pregnancy: G3P2002 at 3570w1d  1. Supervision of normal first pregnancy in first trimester FHT and FH normal.  Preterm labor symptoms and general obstetric precautions including but not limited to vaginal bleeding, contractions, leaking of fluid and fetal movement were reviewed in detail with the patient. Please refer to After Visit Summary for other counseling recommendations.  Return in about 2 weeks (around  09/10/2016).   Levie HeritageJacob J Rasheka Denard, DO

## 2016-09-08 ENCOUNTER — Inpatient Hospital Stay (HOSPITAL_COMMUNITY): Payer: BLUE CROSS/BLUE SHIELD | Admitting: Anesthesiology

## 2016-09-08 ENCOUNTER — Inpatient Hospital Stay (HOSPITAL_COMMUNITY): Payer: BLUE CROSS/BLUE SHIELD

## 2016-09-08 ENCOUNTER — Inpatient Hospital Stay (HOSPITAL_COMMUNITY)
Admission: AD | Admit: 2016-09-08 | Discharge: 2016-09-10 | DRG: 775 | Disposition: A | Discharge status: Home / Self Care | Payer: BLUE CROSS/BLUE SHIELD | Source: Ambulatory Visit | Attending: Family Medicine | Admitting: Family Medicine

## 2016-09-08 ENCOUNTER — Encounter (HOSPITAL_COMMUNITY): Payer: Self-pay | Admitting: *Deleted

## 2016-09-08 DIAGNOSIS — O288 Other abnormal findings on antenatal screening of mother: Secondary | ICD-10-CM

## 2016-09-08 DIAGNOSIS — O4100X0 Oligohydramnios, unspecified trimester, not applicable or unspecified: Secondary | ICD-10-CM

## 2016-09-08 DIAGNOSIS — O4103X0 Oligohydramnios, third trimester, not applicable or unspecified: Secondary | ICD-10-CM | POA: Diagnosis present

## 2016-09-08 DIAGNOSIS — O289 Unspecified abnormal findings on antenatal screening of mother: Secondary | ICD-10-CM

## 2016-09-08 DIAGNOSIS — Z3401 Encounter for supervision of normal first pregnancy, first trimester: Secondary | ICD-10-CM

## 2016-09-08 DIAGNOSIS — Z3A36 36 weeks gestation of pregnancy: Secondary | ICD-10-CM

## 2016-09-08 DIAGNOSIS — Z3A35 35 weeks gestation of pregnancy: Secondary | ICD-10-CM

## 2016-09-08 DIAGNOSIS — O4703 False labor before 37 completed weeks of gestation, third trimester: Secondary | ICD-10-CM

## 2016-09-08 DIAGNOSIS — Z8249 Family history of ischemic heart disease and other diseases of the circulatory system: Secondary | ICD-10-CM

## 2016-09-08 DIAGNOSIS — O368390 Maternal care for abnormalities of the fetal heart rate or rhythm, unspecified trimester, not applicable or unspecified: Secondary | ICD-10-CM | POA: Diagnosis present

## 2016-09-08 LAB — CBC
HEMATOCRIT: 33.8 % — AB (ref 36.0–46.0)
HEMOGLOBIN: 10.9 g/dL — AB (ref 12.0–15.0)
MCH: 26 pg (ref 26.0–34.0)
MCHC: 32.2 g/dL (ref 30.0–36.0)
MCV: 80.5 fL (ref 78.0–100.0)
Platelets: 284 10*3/uL (ref 150–400)
RBC: 4.2 MIL/uL (ref 3.87–5.11)
RDW: 15.5 % (ref 11.5–15.5)
WBC: 8.7 10*3/uL (ref 4.0–10.5)

## 2016-09-08 LAB — URINALYSIS, ROUTINE W REFLEX MICROSCOPIC
Bilirubin Urine: NEGATIVE
Glucose, UA: NEGATIVE mg/dL
Hgb urine dipstick: NEGATIVE
KETONES UR: NEGATIVE mg/dL
NITRITE: NEGATIVE
PH: 6.5 (ref 5.0–8.0)
Protein, ur: NEGATIVE mg/dL
Specific Gravity, Urine: 1.02 (ref 1.005–1.030)

## 2016-09-08 LAB — URINE MICROSCOPIC-ADD ON: RBC / HPF: NONE SEEN RBC/hpf (ref 0–5)

## 2016-09-08 LAB — WET PREP, GENITAL
Clue Cells Wet Prep HPF POC: NONE SEEN
SPERM: NONE SEEN
Trich, Wet Prep: NONE SEEN
YEAST WET PREP: NONE SEEN

## 2016-09-08 LAB — TYPE AND SCREEN
ABO/RH(D): O POS
ANTIBODY SCREEN: NEGATIVE

## 2016-09-08 MED ORDER — DIBUCAINE 1 % RE OINT: 1.0000 "application " | TOPICAL_OINTMENT | RECTAL | Status: DC | PRN

## 2016-09-08 MED ORDER — IBUPROFEN 600 MG PO TABS
600.0000 mg | ORAL_TABLET | Freq: Four times a day (QID) | ORAL | Status: DC
  Filled 2016-09-08: qty 1

## 2016-09-08 MED ORDER — PHENYLEPHRINE 40 MCG/ML (10ML) SYRINGE FOR IV PUSH (FOR BLOOD PRESSURE SUPPORT)
80.0000 ug | PREFILLED_SYRINGE | INTRAVENOUS | Status: DC | PRN
  Filled 2016-09-08: qty 5

## 2016-09-08 MED ORDER — DIPHENHYDRAMINE HCL 25 MG PO CAPS: 25.0000 mg | ORAL_CAPSULE | Freq: Four times a day (QID) | ORAL | Status: DC | PRN

## 2016-09-08 MED ORDER — NIFEDIPINE 10 MG PO CAPS
20.0000 mg | ORAL_CAPSULE | Freq: Once | ORAL | Status: DC
  Filled 2016-09-08: qty 2

## 2016-09-08 MED ORDER — ACETAMINOPHEN 325 MG PO TABS: 650.0000 mg | ORAL_TABLET | ORAL | Status: DC | PRN

## 2016-09-08 MED ORDER — LACTATED RINGERS IV SOLN
INTRAVENOUS | Status: DC
  Administered 2016-09-08 (×3): via INTRAVENOUS

## 2016-09-08 MED ORDER — PRENATAL MULTIVITAMIN CH: 1.0000 | ORAL_TABLET | Freq: Every day | ORAL | Status: DC

## 2016-09-08 MED ORDER — LACTATED RINGERS IV SOLN
INTRAVENOUS | Status: DC
Start: 2016-09-08 — End: 2016-09-08
  Administered 2016-09-08: 17:00:00 via INTRAUTERINE

## 2016-09-08 MED ORDER — ZOLPIDEM TARTRATE 5 MG PO TABS: 5.0000 mg | ORAL_TABLET | Freq: Every evening | ORAL | Status: DC | PRN

## 2016-09-08 MED ORDER — SENNOSIDES-DOCUSATE SODIUM 8.6-50 MG PO TABS
2.0000 | ORAL_TABLET | ORAL | Status: DC
  Filled 2016-09-08 (×2): qty 2

## 2016-09-08 MED ORDER — DIPHENHYDRAMINE HCL 50 MG/ML IJ SOLN: 12.5000 mg | INTRAMUSCULAR | Status: DC | PRN

## 2016-09-08 MED ORDER — COCONUT OIL OIL: 1.0000 "application " | TOPICAL_OIL | Status: DC | PRN

## 2016-09-08 MED ORDER — CALCIUM CARBONATE ANTACID 500 MG PO CHEW: 2.0000 | CHEWABLE_TABLET | ORAL | Status: DC | PRN

## 2016-09-08 MED ORDER — WITCH HAZEL-GLYCERIN EX PADS: 1.0000 "application " | MEDICATED_PAD | CUTANEOUS | Status: DC | PRN

## 2016-09-08 MED ORDER — PHENYLEPHRINE 40 MCG/ML (10ML) SYRINGE FOR IV PUSH (FOR BLOOD PRESSURE SUPPORT)
80.0000 ug | PREFILLED_SYRINGE | INTRAVENOUS | Status: DC | PRN
  Filled 2016-09-08: qty 10
  Filled 2016-09-08: qty 5

## 2016-09-08 MED ORDER — LIDOCAINE HCL (PF) 2 % IJ SOLN
INTRAMUSCULAR | Status: DC | PRN
  Administered 2016-09-08: 7 mL via EPIDURAL
  Administered 2016-09-08: 3 mL via EPIDURAL

## 2016-09-08 MED ORDER — DIPHENHYDRAMINE HCL 50 MG/ML IJ SOLN
12.5000 mg | INTRAMUSCULAR | Status: DC | PRN
Start: 2016-09-08 — End: 2016-09-08

## 2016-09-08 MED ORDER — BETAMETHASONE SOD PHOS & ACET 6 (3-3) MG/ML IJ SUSP
12.0000 mg | Freq: Once | INTRAMUSCULAR | Status: AC
  Administered 2016-09-08: 12 mg via INTRAMUSCULAR
  Filled 2016-09-08: qty 2

## 2016-09-08 MED ORDER — TETANUS-DIPHTH-ACELL PERTUSSIS 5-2.5-18.5 LF-MCG/0.5 IM SUSP: 0.5000 mL | Freq: Once | INTRAMUSCULAR | Status: DC

## 2016-09-08 MED ORDER — BENZOCAINE-MENTHOL 20-0.5 % EX AERO: 1.0000 "application " | INHALATION_SPRAY | CUTANEOUS | Status: DC | PRN

## 2016-09-08 MED ORDER — EPHEDRINE 5 MG/ML INJ
10.0000 mg | INTRAVENOUS | Status: DC | PRN
  Filled 2016-09-08: qty 2

## 2016-09-08 MED ORDER — EPHEDRINE 5 MG/ML INJ
10.0000 mg | INTRAVENOUS | Status: DC | PRN
Start: 2016-09-08 — End: 2016-09-08
  Filled 2016-09-08: qty 2

## 2016-09-08 MED ORDER — NIFEDIPINE 10 MG PO CAPS: 10.0000 mg | ORAL_CAPSULE | Freq: Four times a day (QID) | ORAL | Status: DC

## 2016-09-08 MED ORDER — TERBUTALINE SULFATE 1 MG/ML IJ SOLN
0.2500 mg | Freq: Once | INTRAMUSCULAR | Status: AC
  Administered 2016-09-08: 0.25 mg via SUBCUTANEOUS
  Filled 2016-09-08: qty 1

## 2016-09-08 MED ORDER — SIMETHICONE 80 MG PO CHEW: 80.0000 mg | CHEWABLE_TABLET | ORAL | Status: DC | PRN

## 2016-09-08 MED ORDER — OXYTOCIN 40 UNITS IN LACTATED RINGERS INFUSION - SIMPLE MED
INTRAVENOUS | Status: AC
  Administered 2016-09-08: 999 mL/h
  Filled 2016-09-08: qty 1000

## 2016-09-08 MED ORDER — LACTATED RINGERS IV BOLUS (SEPSIS)
1000.0000 mL | Freq: Once | INTRAVENOUS | Status: AC
  Administered 2016-09-08: 1000 mL via INTRAVENOUS

## 2016-09-08 MED ORDER — BETAMETHASONE SOD PHOS & ACET 6 (3-3) MG/ML IJ SUSP
12.0000 mg | INTRAMUSCULAR | Status: AC
  Administered 2016-09-08: 12 mg via INTRAMUSCULAR
  Filled 2016-09-08: qty 2

## 2016-09-08 MED ORDER — NIFEDIPINE 10 MG PO CAPS: 20.0000 mg | ORAL_CAPSULE | Freq: Once | ORAL | Status: DC

## 2016-09-08 MED ORDER — LACTATED RINGERS IV SOLN: 500.0000 mL | Freq: Once | INTRAVENOUS | Status: DC

## 2016-09-08 MED ORDER — DOCUSATE SODIUM 100 MG PO CAPS: 100.0000 mg | ORAL_CAPSULE | Freq: Every day | ORAL | Status: DC

## 2016-09-08 MED ORDER — LIDOCAINE HCL (PF) 1 % IJ SOLN
INTRAMUSCULAR | Status: DC | PRN
  Administered 2016-09-08: 6 mL via EPIDURAL

## 2016-09-08 MED ORDER — ONDANSETRON HCL 4 MG/2ML IJ SOLN: 4.0000 mg | INTRAMUSCULAR | Status: DC | PRN

## 2016-09-08 MED ORDER — PENICILLIN G POT IN DEXTROSE 60000 UNIT/ML IV SOLN
3.0000 10*6.[IU] | INTRAVENOUS | Status: DC
  Filled 2016-09-08 (×2): qty 50

## 2016-09-08 MED ORDER — FENTANYL 2.5 MCG/ML BUPIVACAINE 1/10 % EPIDURAL INFUSION (WH - ANES): 14.0000 mL/h | INTRAMUSCULAR | Status: DC | PRN

## 2016-09-08 MED ORDER — ONDANSETRON HCL 4 MG PO TABS: 4.0000 mg | ORAL_TABLET | ORAL | Status: DC | PRN

## 2016-09-08 MED ORDER — PENICILLIN G POTASSIUM 5000000 UNITS IJ SOLR
5.0000 10*6.[IU] | Freq: Once | INTRAVENOUS | Status: AC
  Administered 2016-09-08: 5 10*6.[IU] via INTRAVENOUS
  Filled 2016-09-08: qty 5

## 2016-09-08 MED ORDER — FENTANYL 2.5 MCG/ML BUPIVACAINE 1/10 % EPIDURAL INFUSION (WH - ANES)
14.0000 mL/h | INTRAMUSCULAR | Status: DC | PRN
  Administered 2016-09-08: 14 mL/h via EPIDURAL
  Filled 2016-09-08: qty 100

## 2016-09-08 NOTE — Anesthesia Pain Management Evaluation Note (Signed)
  [facility] Pain Management Visit Note  Patient: Ivor CostaBrittany Matzen, 25 y.o., female  "Hello I am a [facility]. We have an anesthesia team available at all times to provide care throughout the hospital, including epidural management and anesthesia for C-section. I don't know your plan for the delivery whether it a natural birth, water birth, IV sedation, nitrous supplementation, doula or epidural, but we want to meet your pain goals."   1.Was your pain managed to your expectations on prior hospitalizations?   Yes   2.What is your expectation for pain management during this hospitalization?     Epidural  3.How can we help you reach that goal?   Record the patient's initial score and the patient's pain goal.   Pain: 10  Pain Goal: 3 [facility] wants you to be able to say your pain was always managed very well.  Laban EmperorMalinova,Anastasio Wogan Hristova 09/08/2016

## 2016-09-08 NOTE — MAU Provider Note (Signed)
Chief Complaint:  Contractions   First Provider Initiated Contact with Patient 09/08/16 0158      HPI: Evelyn Cox is a 25 y.o. G3P2002 at 5338w6d who presents to maternity admissions reporting strong contractions and pressure.  Patient states [facility] month [facility], [facility], was observed overnight due to frequent variable decelerations and for contractions. Patient states since then, she has daily contractions but it wasn't until about 3 hours ago she felt the contractions were getting really strong and about 5-7 min apart and felt a lot of vaginal pressure. Patient states she drinks multiple Mountain Dews every day and Dr. Alcus DadPeppers, but does not drink water. She has a good appetite. She denies any problems with the pregnancy. Denies any change in her vaginal discharge. Denies CP/SOB, N/V/D. Admits to increased urinary frequency and urgency. No dysuria.   Denies leakage of fluid or vaginal bleeding. Good fetal movement.    Past Medical History: No past medical history on file.  Past obstetric history: OB History  Gravida Para Term Preterm AB Living  3 2 2     2   SAB TAB Ectopic Multiple Live Births          1    # Outcome Date GA Lbr Len/2nd Weight Sex Delivery Anes PTL Lv  3 Current           2 Term 06/30/12 9427w0d  6 lb 12 oz (3.062 kg)  Vag-Spont  N LIV  1 Term 09/26/09 4327w0d  6 lb 12 oz (3.062 kg)  Vag-Spont         Past Surgical History: No past surgical history on file.   Family History: Family History  Problem Relation Age of Onset  . Hypertension Mother     Social History: Social History  Substance Use Topics  . Smoking status: Never Smoker  . Smokeless tobacco: Never Used  . Alcohol use 1.2 oz/week    2 Standard drinks or equivalent per week    Allergies: No Known Allergies  Meds:  Prescriptions Prior to Admission  Medication Sig Dispense Refill Last Dose  . Prenatal Vit-Fe Fum-Fe Bisg-FA (NATACHEW) 28-1 MG CHEW Chew 1 each by  mouth daily.   Taking    I have reviewed patient's Past Medical Hx, Surgical Hx, Family Hx, Social Hx, medications and allergies.   ROS:  A comprehensive ROS was negative except per HPI.    Physical Exam  Patient Vitals for the past 24 hrs:  BP Temp Temp src Pulse Resp Height Weight  09/08/16 0115 111/74 98 F (36.7 C) Oral 70 20 5\' 7"  (1.702 m) 131 lb 12 oz (59.8 kg)   Constitutional: Well-developed, well-nourished female in no acute distress.  Cardiovascular: normal rate, rhythm, no murmurs Respiratory: normal effort, CTAB GI: Abd soft, non-tender, gravid appropriate for gestational age. Pos BS x 4 MS: Extremities nontender, no edema, normal ROM Neurologic: Alert and oriented x 4.  GU: Neg CVAT. Pelvic: NEFG, physiologic discharge on glove, no blood. No CMT Dilation: 1 Effacement (%): 50 Station: -2 Presentation: Vertex Exam by:: Dr Omer JackMumaw  FHT:  Baseline 140, moderate variability, no accels, multiple variable decelerations nadir to 60-90s with return to baseline on own. Category II Contractions: Irregular but present.   Labs: Results for orders placed or performed during the hospital encounter of 09/08/16 (from the past 24 hour(s))  Urinalysis, Routine w reflex microscopic (not [facility])     Status: Abnormal   Collection Time: 09/08/16  1:18 AM  Result Value Ref Range   Color, Urine YELLOW YELLOW   APPearance CLEAR CLEAR   Specific Gravity, Urine 1.020 1.005 - 1.030   pH 6.5 5.0 - 8.0   Glucose, UA NEGATIVE NEGATIVE mg/dL   Hgb urine dipstick NEGATIVE NEGATIVE   Bilirubin Urine NEGATIVE NEGATIVE   Ketones, ur NEGATIVE NEGATIVE mg/dL   Protein, ur NEGATIVE NEGATIVE mg/dL   Nitrite NEGATIVE NEGATIVE   Leukocytes, UA SMALL (A) NEGATIVE  Urine microscopic-add on     Status: Abnormal   Collection Time: 09/08/16  1:18 AM  Result Value Ref Range   Squamous Epithelial / LPF 6-30 (A) NONE SEEN   WBC, UA 6-30 0 - 5 WBC/hpf   RBC / HPF NONE SEEN 0 - 5 RBC/hpf   Bacteria,  UA MANY (A) NONE SEEN   Urine-Other MUCOUS PRESENT     Imaging:  No results found.  MAU Course: IVF Bolus x2 Terbutaline x1 BPP: 8/8 but borderline oligo with AFI 5.52cm Betamethasone x1 given  I personally reviewed the patient's NST today, found to be NON-REACTIVE. 140 bpm, mod var, no accels, multiple recurrent variable decels. CTX: q2-113min prior to terbutaline.   MDM: Plan of care reviewed with patient, including labs and tests ordered and medical treatment. Patient agreeable to admission, understands plan of care.   Assessment/Plan: Preterm contractions Non-reassuring fetal heart tones with multiple variable decels Borderline Oligohydramnios  Admit to Antenatal Continuous fetal monitoring Growth US in AM Tocolysis for betamethasone doses IVF  Jen MowElizabeth Hartleigh Edmonston, DO OB Fellow Center for [facility], [facility] 09/08/2016 1:59 AM

## 2016-09-08 NOTE — Anesthesia Procedure Notes (Addendum)
Epidural Patient location during procedure: OB Start time: 09/08/2016 3:15 PM End time: 09/08/2016 3:20 PM  Staffing Anesthesiologist: Leilani AbleHATCHETT, Dewitt Judice Performed: anesthesiologist   Preanesthetic Checklist Completed: patient identified, surgical consent, pre-op evaluation, timeout performed, IV checked, risks and benefits discussed and monitors and equipment checked  Epidural Patient position: sitting Prep: site prepped and draped and DuraPrep Patient monitoring: continuous pulse ox and blood pressure Approach: midline Location: L3-L4 Injection technique: LOR air  Needle:  Needle type: Tuohy  Needle gauge: 17 G Needle length: 9 cm and 9 Needle insertion depth: 6 cm Catheter type: closed end flexible Catheter size: 19 Gauge Catheter at skin depth: 11 cm Test dose: negative and Other  Assessment Sensory level: T11 Events: blood not aspirated, injection not painful, no injection resistance, negative IV test and no paresthesia  Additional Notes Reason for block:procedure for pain

## 2016-09-08 NOTE — MAU Note (Signed)
PT  SAYS SHE  IS HAVING  UC - STARTED   SAT-    NOW   THINKS   STRONGER UC  AND  CLOSER    AND  BACK AND  RECTUM  HURTS.  PNC-      NO VE.  DENIES HSV AND MRSA  .  GBS- NOT  DONE YET.   LAST SEX-    SAT.

## 2016-09-08 NOTE — H&P (Signed)
ANTEPARTUM ADMISSION HISTORY AND PHYSICAL NOTE   History of Present Illness:  Evelyn Cox is a 25 y.o. G3P2002 at 3450w6d who presented to maternity admissions reporting strong contractions and pressure.  Patient states one month ago, she [facility], was observed overnight due to frequent variable decelerations and for contractions. Patient states since then, she has daily contractions but it wasn't until about 3 hours ago she felt the contractions were getting really strong and about 5-7 min apart and felt a lot of vaginal pressure. Patient states she drinks multiple Mountain Dews every day and Dr. Alcus DadPeppers, but does not drink water. She has a good appetite. She denies any problems with the pregnancy. Denies any change in her vaginal discharge. Denies CP/SOB, N/V/D. Admits to increased urinary frequency and urgency. No dysuria.   Denies leakage of fluid or vaginal bleeding. Good fetal movement.   During patient's MAU course, FHT was noted to have multiple recurrent deep decelerations and contractions q2-583min. She was given a BPP and found to be 8/8 but had an AFI of only 5.52cm. She had repetitive decelerations until terbutaline was given, and her contractions spaced out and FHT improved. It was determined to monitor overnight and have a growth US performed in AM. Betamethasone x1 was given in MAU and GBS culture was also taken.   Evelyn CostaBrittany Bloxom is a 25 y.o. G3P2002 at 8150w6d admitted for preterm contractions, borderline oligohydramnios, and multiple recurrent variable decelerations (NRFHT). Patient reports the fetal movement as active. Patient reports uterine contraction  activity as regular, every 5 minutes. Patient reports  vaginal bleeding as none. Patient describes fluid per vagina as None. Fetal presentation is cephalic.  Patient Active Problem List   Diagnosis Date Noted  . Variable fetal heart rate decelerations, antepartum 09/08/2016  . Preterm  uterine contractions in third trimester, antepartum 08/13/2016  . Supervision of normal first pregnancy in first trimester 02/26/2016  . First trimester bleeding 02/22/2016    No past medical history on file.  No past surgical history on file.  OB History  Gravida Para Term Preterm AB Living  3 2 2     2   SAB TAB Ectopic Multiple Live Births          1    # Outcome Date GA Lbr Len/2nd Weight Sex Delivery Anes PTL Lv  3 Current           2 Term 06/30/12 5958w0d  6 lb 12 oz (3.062 kg)  Vag-Spont  N LIV  1 Term 09/26/09 6458w0d  6 lb 12 oz (3.062 kg)  Vag-Spont         Social History   Social History  . Marital status: Single    Spouse name: N/A  . Number of children: N/A  . Years of education: N/A   Social History Main Topics  . Smoking status: Never Smoker  . Smokeless tobacco: Never Used  . Alcohol use 1.2 oz/week    2 Standard drinks or equivalent per week  . Drug use: No  . Sexual activity: Yes    Birth control/ protection: Pill   Other Topics Concern  . None   Social History Narrative  . None    Family History  Problem Relation Age of Onset  . Hypertension Mother     No Known Allergies  Prescriptions Prior to Admission  Medication Sig Dispense Refill Last Dose  . Prenatal Vit-Fe Fum-Fe Bisg-FA (NATACHEW) 28-1 MG CHEW Chew 1 each by mouth daily.  Taking    Review of Systems - Negative except per HPI.  Vitals:  BP 111/74 (BP Location: Right Arm)   Pulse 70   Temp 98 F (36.7 C) (Oral)   Resp 20   Ht 5\' 7"  (1.702 m)   Wt 131 lb 12 oz (59.8 kg)   LMP 01/01/2016 (Approximate)   BMI 20.63 kg/m   Physical Examination: Constitutional: Well-developed, well-nourished female in no acute distress.  Cardiovascular: normal rate, rhythm, no murmurs Respiratory: normal effort, CTAB GI: Abd soft, non-tender, gravid appropriate for gestational age. Pos BS x 4 MS: Extremities nontender, no edema, normal ROM Neurologic: Alert and oriented x 4.  GU: Neg  CVAT. Pelvic: NEFG, physiologic discharge on glove, no blood. No CMT   Cervix: Evaluated by digital exam. and found to be 1 cm/ 50%/-2 and fetal presentation is cephalic. Membranes:intact Fetal Monitoring:Baseline: 140 bpm, mod var, no accels, multiple variable decels recurrent, nadir to 60-90s with recovery to baseline. Tocometer: q2 min  Labs:  Results for orders placed or performed during the hospital encounter of 09/08/16 (from the past 24 hour(s))  Urinalysis, Routine w reflex microscopic (not at [facility])   Collection Time: 09/08/16  1:18 AM  Result Value Ref Range   Color, Urine YELLOW YELLOW   APPearance CLEAR CLEAR   Specific Gravity, Urine 1.020 1.005 - 1.030   pH 6.5 5.0 - 8.0   Glucose, UA NEGATIVE NEGATIVE mg/dL   Hgb urine dipstick NEGATIVE NEGATIVE   Bilirubin Urine NEGATIVE NEGATIVE   Ketones, ur NEGATIVE NEGATIVE mg/dL   Protein, ur NEGATIVE NEGATIVE mg/dL   Nitrite NEGATIVE NEGATIVE   Leukocytes, UA SMALL (A) NEGATIVE  Urine microscopic-add on   Collection Time: 09/08/16  1:18 AM  Result Value Ref Range   Squamous Epithelial / LPF 6-30 (A) NONE SEEN   WBC, UA 6-30 0 - 5 WBC/hpf   RBC / HPF NONE SEEN 0 - 5 RBC/hpf   Bacteria, UA MANY (A) NONE SEEN   Urine-Other MUCOUS PRESENT   Wet prep, genital   Collection Time: 09/08/16  1:50 AM  Result Value Ref Range   Yeast Wet Prep HPF POC NONE SEEN NONE SEEN   Trich, Wet Prep NONE SEEN NONE SEEN   Clue Cells Wet Prep HPF POC NONE SEEN NONE SEEN   WBC, Wet Prep HPF POC FEW (A) NONE SEEN   Sperm NONE SEEN   Type and screen [facility]   Collection Time: 09/08/16  1:50 AM  Result Value Ref Range   ABO/RH(D) O POS    Antibody Screen PENDING    Sample Expiration 09/11/2016     Imaging Studies: No results found. BPP 8/8 AFI 5.52cm  Assessment and Plan: Patient Active Problem List   Diagnosis Date Noted  . Variable fetal heart rate decelerations, antepartum 09/08/2016  . Preterm uterine  contractions in third trimester, antepartum 08/13/2016  . Supervision of normal first pregnancy in first trimester 02/26/2016  . First trimester bleeding 02/22/2016   Preterm contractions Non-reassuring fetal heart tones with multiple variable decels Borderline Oligohydramnios  Admit to Antenatal Continuous fetal monitoring Growth US in AM Betamethasone IVF boluses x2   Jen MowElizabeth Meris Reede, DO OB Fellow Faculty Practice, [facility]

## 2016-09-08 NOTE — MAU Note (Signed)
US at bedside

## 2016-09-08 NOTE — MAU Note (Signed)
Pt on bedpan.

## 2016-09-08 NOTE — Anesthesia Preprocedure Evaluation (Signed)

## 2016-09-08 NOTE — Progress Notes (Addendum)
Patient seen.reporting increased abdominal and back pain coming in waves. Category 2 tracing with variable decelerations with contractions every 3-4 minutes. Plan to get 12 hour BMZ and transfer to birthing suites. Will rupture place IUPC/amnio infusion and augment as need. GBS has been collected. Will treat in labor.

## 2016-09-09 LAB — CBC
HCT: 27.3 % — ABNORMAL LOW (ref 36.0–46.0)
Hemoglobin: 9 g/dL — ABNORMAL LOW (ref 12.0–15.0)
MCH: 26.1 pg (ref 26.0–34.0)
MCHC: 33 g/dL (ref 30.0–36.0)
MCV: 79.1 fL (ref 78.0–100.0)
Platelets: 250 10*3/uL (ref 150–400)
RBC: 3.45 MIL/uL — ABNORMAL LOW (ref 3.87–5.11)
RDW: 15.5 % (ref 11.5–15.5)
WBC: 15.1 10*3/uL — ABNORMAL HIGH (ref 4.0–10.5)

## 2016-09-09 MED ORDER — OXYCODONE HCL 5 MG/5ML PO SOLN
5.0000 mg | ORAL | Status: DC | PRN
  Administered 2016-09-09 – 2016-09-10 (×3): 5 mg via ORAL
  Filled 2016-09-09 (×3): qty 5

## 2016-09-09 MED ORDER — IBUPROFEN 100 MG/5ML PO SUSP
600.0000 mg | Freq: Four times a day (QID) | ORAL | Status: DC
  Administered 2016-09-09 – 2016-09-10 (×7): 600 mg via ORAL
  Filled 2016-09-09 (×11): qty 30

## 2016-09-09 MED ORDER — COMPLETENATE 29-1 MG PO CHEW
1.0000 | CHEWABLE_TABLET | Freq: Every day | ORAL | Status: DC
  Administered 2016-09-09 – 2016-09-10 (×2): 1 via ORAL
  Filled 2016-09-09 (×3): qty 1

## 2016-09-09 MED ORDER — ACETAMINOPHEN 160 MG/5ML PO SOLN
325.0000 mg | ORAL | Status: DC | PRN
  Administered 2016-09-10: 325 mg via ORAL
  Filled 2016-09-09 (×2): qty 20.3

## 2016-09-09 NOTE — Progress Notes (Signed)
Patient ID: Evelyn Cox, female   DOB: 06/19/1991, 25 y.o.   MRN: [id number]  POSTPARTUM PROGRESS NOTE  Post Partum Day 1 Subjective:  Evelyn Cox is a 25 y.o. G3P2002 5238w0d s/p SVD.  No acute events overnight.  Pt denies problems with ambulating, voiding or po intake.  She denies nausea or vomiting.  Pain is moderately controlled.  She has had flatus. She has had bowel movement.  Lochia Minimal.   Objective: Blood pressure (!) 96/58, pulse 63, temperature 98 F (36.7 C), temperature source Oral, resp. rate 18, height 5\' 7"  (1.702 m), weight 131 lb 12 oz (59.8 kg), last menstrual period 01/01/2016, SpO2 99 %.  Physical Exam:  General: alert, cooperative and no distress Lochia:normal flow Chest: CTAB Heart: RRR no m/r/g Abdomen: +BS, soft, nontender,  Uterine Fundus: firm DVT Evaluation: No calf swelling or tenderness Extremities: No edema   Recent Labs  09/08/16 0150 09/09/16 0530  HGB 10.9* 9.0*  HCT 33.8* 27.3*   Assessment/Plan:  ASSESSMENT: Evelyn Cox is a 25 y.o. [id number] 3238w0d s/p SVD.   Plan for discharge tomorrow    LOS: 1 day   Freddrick MarchYashika Amin, MD PGY-1 Center for [facility], [facility]  09/09/2016, 7:50 AM    OB FELLOW POSTPARTUM PROGRESS NOTE ATTESTATION  I have seen and examined this patient and agree with above documentation in the resident's note.   Jen MowElizabeth Shante Maysonet, DO OB Fellow

## 2016-09-09 NOTE — Anesthesia Postprocedure Evaluation (Signed)
Anesthesia Post Note  Patient: Ivor CostaBrittany Talcott  Procedure(s) Performed: * No procedures listed *  Patient location during evaluation: Mother Baby Anesthesia Type: Epidural Level of consciousness: awake and alert and oriented Pain management: satisfactory to patient Vital Signs Assessment: post-procedure vital signs reviewed and stable Respiratory status: spontaneous breathing and nonlabored ventilation Cardiovascular status: stable Postop Assessment: no headache, no backache, no signs of nausea or vomiting, adequate PO intake and patient able to bend at knees (patient up walking) Anesthetic complications: no     Last Vitals:  Vitals:   09/08/16 2202 09/09/16 0204  BP: 106/62 (!) 96/58  Pulse: 74 63  Resp: 18 18  Temp: 36.7 C 36.7 C    Last Pain:  Vitals:   09/09/16 0750  TempSrc:   PainSc: 3    Pain Goal: Patients Stated Pain Goal: 3 (09/09/16 0750)               Madison HickmanGREGORY,Levenia Skalicky

## 2016-09-10 ENCOUNTER — Encounter: Payer: BLUE CROSS/BLUE SHIELD | Admitting: Obstetrics & Gynecology

## 2016-09-10 LAB — CULTURE, BETA STREP (GROUP B ONLY)

## 2016-09-10 MED ORDER — IBUPROFEN 100 MG/5ML PO SUSP: 600.0000 mg | Freq: Four times a day (QID) | ORAL | 0 refills | Status: DC

## 2016-09-10 MED ORDER — SENNOSIDES-DOCUSATE SODIUM 8.6-50 MG PO TABS: 2.0000 | ORAL_TABLET | ORAL | 0 refills | Status: DC

## 2016-09-10 NOTE — Discharge Instructions (Signed)

## 2016-09-10 NOTE — Discharge Summary (Signed)
OB Discharge Summary     Patient Name: Evelyn CostaBrittany Cox DOB: 10/16/1990 MRN: [id number]  Date of admission: 09/08/2016 Delivering MD: Lorne SkeensSCHENK, NICHOLAS MICHAEL   Date of discharge: 09/10/2016  Admitting diagnosis: 35wks, contractions Intrauterine pregnancy: 7559w1d     Secondary diagnosis:  Active Problems:   Variable fetal heart rate decelerations, antepartum  Additional problems: IUGR and absent end diastolic flow noted on admission U/S     Discharge diagnosis: Preterm Pregnancy Delivered                                                                                                Post partum procedures:None  Augmentation: AROM  Complications: None  Hospital course:  Onset of Labor With Vaginal Delivery     25 y.o. yo [id number] at 6259w1d was admitted in Latent Labor with FHR variables on 09/08/2016. She spent the night on Antenatal and rec'd BMZ x 2 doses 12 hrs apart. She continued with a Category 2 tracing and so her membranes were ruptured to augment her labor. Patient had an uncomplicated labor course as follows:  Membrane Rupture Time/Date: 5:01 PM ,09/08/2016   Intrapartum Procedures: Episiotomy: None [1]                                         Lacerations:  None [1]  Patient had a delivery of a Viable infant. 09/08/2016  Information for the patient's newborn:  Demaris Cox, Boy Evelyn [[id number]][[id number]]  Delivery Method: Vaginal, Spontaneous Delivery (Filed from Delivery Summary)    Pateint had an uncomplicated postpartum course.  She is ambulating, tolerating a regular diet, passing flatus, and urinating well. Patient is discharged home in stable condition on 09/10/16.    Physical exam Vitals:   09/09/16 0204 09/09/16 1030 09/09/16 2018 09/10/16 0533  BP: (!) 96/58 (!) 107/55 102/63 94/73  Pulse: 63 67 71 (!) 55  Resp: 18 18 18 18   Temp: 98 F (36.7 C) 98.2 F (36.8 C) 98.6 F (37 C) 97.8 F (36.6 C)  TempSrc: Oral Oral Oral Oral  SpO2: 99%     Weight:      Height:        General: alert, cooperative and no distress Lochia: appropriate Uterine Fundus: firm Incision: N/A DVT Evaluation: No evidence of DVT seen on physical exam. Labs: Lab Results  Component Value Date   WBC 15.1 (H) 09/09/2016   HGB 9.0 (L) 09/09/2016   HCT 27.3 (L) 09/09/2016   MCV 79.1 09/09/2016   PLT 250 09/09/2016   No flowsheet data found.  Discharge instruction: per After Visit Summary and "Baby and Me Booklet".  After visit meds:    Medication List    TAKE these medications   ibuprofen 100 MG/5ML suspension Commonly known as:  ADVIL,MOTRIN Take 30 mLs (600 mg total) by mouth every 6 (six) hours.   senna-docusate 8.6-50 MG tablet Commonly known as:  Senokot-S Take 2 tablets by mouth daily. Start taking on:  09/11/2016       Diet: routine  diet  Activity: Advance as tolerated. Pelvic rest for 6 weeks.   Outpatient follow up:6 weeks Follow up Appt:Future Appointments Date Time Provider Department Center  10/14/2016 1:15 PM Willodean Rosenthalarolyn Harraway-Smith, MD [facility] None   Follow up Visit:No Follow-up on file.  Postpartum contraception: Depo Provera  Newborn Data: Live born female  Birth Weight: 4 lb 12.9 oz (2180 g) APGAR: 7, 9  Baby Feeding: Bottle Disposition:home with mother  09/10/2016 Evelyn MarchYashika Amin, MD   CNM attestation I have seen and examined this patient and agree with above documentation in the resident's note.   Evelyn CostaBrittany Jenny is a 25 y.o. [id number] s/p SVD.   Pain is well controlled.  Plan for birth control is Depo-Provera.  Method of Feeding: bottle  PE:  BP 94/73 (BP Location: Right Arm)   Pulse (!) 55   Temp 97.8 F (36.6 C) (Oral)   Resp 18   Ht 5\' 7"  (1.702 m)   Wt 59.8 kg (131 lb 12 oz)   LMP 01/01/2016 (Approximate)   SpO2 99%   BMI 20.63 kg/m  Fundus firm  No results for input(s): HGB, HCT in the last 72 hours.   Plan: discharge today - postpartum care discussed - f/u clinic in 6 weeks for postpartum visit   SHAW,  KIMBERLY, CNM 5:19 PM

## 2016-09-21 ENCOUNTER — Encounter: Payer: BLUE CROSS/BLUE SHIELD | Admitting: Obstetrics & Gynecology

## 2016-09-28 ENCOUNTER — Encounter: Payer: BLUE CROSS/BLUE SHIELD | Admitting: Obstetrics & Gynecology

## 2016-10-08 ENCOUNTER — Ambulatory Visit: Payer: BLUE CROSS/BLUE SHIELD | Admitting: Family Medicine

## 2016-10-14 ENCOUNTER — Ambulatory Visit: Payer: BLUE CROSS/BLUE SHIELD | Admitting: Obstetrics & Gynecology

## 2016-10-26 ENCOUNTER — Encounter: Payer: Self-pay | Admitting: Family Medicine

## 2016-10-26 ENCOUNTER — Ambulatory Visit (INDEPENDENT_AMBULATORY_CARE_PROVIDER_SITE_OTHER): Payer: BLUE CROSS/BLUE SHIELD | Admitting: Family Medicine

## 2016-10-26 VITALS — BP 114/75 | HR 60 | Ht 67.0 in | Wt 113.0 lb

## 2016-10-26 DIAGNOSIS — Z3202 Encounter for pregnancy test, result negative: Secondary | ICD-10-CM

## 2016-10-26 DIAGNOSIS — Z309 Encounter for contraceptive management, unspecified: Secondary | ICD-10-CM

## 2016-10-26 DIAGNOSIS — Z3042 Encounter for surveillance of injectable contraceptive: Secondary | ICD-10-CM

## 2016-10-26 LAB — POCT URINE PREGNANCY: Preg Test, Ur: NEGATIVE

## 2016-10-26 MED ORDER — MEDROXYPROGESTERONE ACETATE 150 MG/ML IM SUSP
150.0000 mg | INTRAMUSCULAR | Status: DC
  Administered 2016-10-26 – 2017-07-30 (×4): 150 mg via INTRAMUSCULAR

## 2016-10-26 MED ORDER — MEDROXYPROGESTERONE ACETATE 150 MG/ML IM SUSP: 150.0000 mg | Freq: Once | INTRAMUSCULAR | 3 refills | Status: DC

## 2016-10-26 NOTE — Progress Notes (Signed)
Patient given Depo provera from clinic stock. Explained to patient that she will need to pick up her Depo Provera from the pharmacy first for further inejctions. Armandina StammerJennifer Indiah Heyden RNBSN

## 2016-10-26 NOTE — Progress Notes (Signed)
Post Partum Exam  Evelyn CostaBrittany Stilley is a 26 y.o. 473P2002 female who presents for a postpartum visit. She is 6 weeks postpartum following a vaginal delivery. I have fully reviewed the prenatal and intrapartum course. The delivery was at 35 gestational weeks.  Anesthesia: epidural Postpartum course has been unremarkable. Baby's course has been unremarkable. Baby is feeding by bottle.. Bleeding: yes.. Bowel function is normal.  Bladder function is normal. Patient is not sexually active. Contraception method is Depo. Postpartum depression screening:Neg (score: 0).   The following portions of the patient's history were reviewed and updated as appropriate: allergies, current medications, past family history, past medical history, past social history, past surgical history and problem list.  Review of Systems Pertinent items are noted in HPI.    Objective:    BP 116/78 mmHg  Pulse 78  Resp 16  Ht 5\' 5"  (1.651 m)  Wt 211 lb (95.709 kg)  BMI 35.11 kg/m2  Breastfeeding? Yes  General:  alert, cooperative and no distress  Lungs: clear to auscultation bilaterally  Heart:  regular rate and rhythm, S1, S2 normal, no murmur, click, rub or gallop  Abdomen: soft, non-tender; bowel sounds normal; no masses,  no organomegaly        Assessment:    Normal postpartum exam. Pap smear not done at today's visit.   Plan:   1. Contraception: Depo-Provera injections 2. Follow up in: 1 year or as needed.

## 2016-11-13 ENCOUNTER — Encounter: Payer: Self-pay | Admitting: Family Medicine

## 2016-11-13 ENCOUNTER — Ambulatory Visit (INDEPENDENT_AMBULATORY_CARE_PROVIDER_SITE_OTHER): Payer: BLUE CROSS/BLUE SHIELD | Admitting: Family Medicine

## 2016-11-13 VITALS — BP 126/76 | HR 68 | Wt 112.0 lb

## 2016-11-13 DIAGNOSIS — N921 Excessive and frequent menstruation with irregular cycle: Secondary | ICD-10-CM | POA: Diagnosis not present

## 2016-11-13 DIAGNOSIS — N611 Abscess of the breast and nipple: Secondary | ICD-10-CM

## 2016-11-13 MED ORDER — MEDROXYPROGESTERONE ACETATE 10 MG PO TABS: 20.0000 mg | ORAL_TABLET | Freq: Every day | ORAL | 2 refills | Status: DC

## 2016-11-13 NOTE — Progress Notes (Signed)
   Subjective:    Patient ID: Evelyn Cox, female    DOB: 06/23/1991, 26 y.o.   MRN: [id number]  HPI Patient presents with 2 complaints. Has bump on right nipple that started yesterday. She had some discomfort there to 3 days prior to the formation of the bump. Symptoms are worsening. The bump is small. She thinks there was some drainage there yesterday. No fevers, chills, nausea, vomiting.  Additionally, patient received Depo-Medrol shot on 1/15 and has had fairly heavy bleeding since that time. She wears a heavy tampon and maxipads at the same time and has to change them every hour or 2 as they're saturated. She is starting to feel drained. No improvements of the symptoms - symptoms are fairly constant and without palliating or provoking factors.   I have reviewed the patients past medical and social history.  I have reviewed the patient's medication list and allergies.  Review of Systems     Objective:   Physical Exam  Constitutional: She is oriented to person, place, and time. She appears well-developed and well-nourished.  Pulmonary/Chest:    Abdominal: Soft. There is no tenderness. There is no rebound.  Neurological: She is alert and oriented to person, place, and time.  Skin: Skin is warm and dry.  Psychiatric: She has a normal mood and affect. Her behavior is normal. Judgment and thought content normal.     Assessment & Plan:  1. Abscess of nipple, right I&D offered, although patient preferred conservative measures - would like to place warm compresses 3-4 times a day at this time. Discussed if getting bigger or erythematous, to return.  2. Breakthrough bleeding on Depo-Provera Provera 20mg  Daily. If not improving, patient to call on Monday.

## 2016-11-13 NOTE — Progress Notes (Signed)
Patient has a follow up appointment with breast center and notice she has a pimple like bump on her right breast. They advise she come see us first before her follow up imaging. Evelyn StammerJennifer Peregrine Cox

## 2017-01-25 ENCOUNTER — Ambulatory Visit (INDEPENDENT_AMBULATORY_CARE_PROVIDER_SITE_OTHER): Payer: BLUE CROSS/BLUE SHIELD

## 2017-01-25 ENCOUNTER — Other Ambulatory Visit: Payer: Self-pay

## 2017-01-25 DIAGNOSIS — Z3042 Encounter for surveillance of injectable contraceptive: Secondary | ICD-10-CM | POA: Diagnosis not present

## 2017-01-25 DIAGNOSIS — Z309 Encounter for contraceptive management, unspecified: Secondary | ICD-10-CM

## 2017-01-25 MED ORDER — MEDROXYPROGESTERONE ACETATE 150 MG/ML IM SUSP: 150.0000 mg | Freq: Once | INTRAMUSCULAR | 0 refills | Status: DC

## 2017-01-25 NOTE — Progress Notes (Signed)
Patient called and states she forgot she was to pick up her Depo Provera. Patient requesting that we send in refill to [facility] [place] for her to pick up and bring to appointment today. Armandina Stammer RNBSN

## 2017-02-28 ENCOUNTER — Encounter: Payer: Self-pay | Admitting: Emergency Medicine

## 2017-02-28 ENCOUNTER — Emergency Department
Admission: EM | Admit: 2017-02-28 | Discharge: 2017-02-28 | Disposition: A | Discharge status: Home / Self Care | Payer: BLUE CROSS/BLUE SHIELD | Source: Home / Self Care | Attending: Family Medicine | Admitting: Family Medicine

## 2017-02-28 DIAGNOSIS — L03115 Cellulitis of right lower limb: Secondary | ICD-10-CM | POA: Diagnosis not present

## 2017-02-28 MED ORDER — SULFAMETHOXAZOLE-TRIMETHOPRIM 800-160 MG PO TABS: 1.0000 | ORAL_TABLET | Freq: Two times a day (BID) | ORAL | 0 refills | Status: AC

## 2017-02-28 MED ORDER — MUPIROCIN 2 % EX OINT: TOPICAL_OINTMENT | CUTANEOUS | 0 refills | Status: DC

## 2017-02-28 NOTE — ED Provider Notes (Signed)
CSN: [id number]     Arrival date & time 02/28/17  1423 History   First MD Initiated Contact with Patient 02/28/17 1439     Chief Complaint  Patient presents with  . Insect Bite   (Consider location/radiation/quality/duration/timing/severity/associated sxs/prior Treatment) HPI Evelyn Cox is a 26 y.o. female presenting to UC with c/o 1 week of gradually worsening redness, warmth, and pain to her Right thigh. Pain is aching and sore, 8/10, worse with ambulation and palpation. She initially thought it was a small insect bite, possibly from a spider, but she never saw anything on her leg prior to symptoms starting. She has taken ibuprofen and used a warm washcloth with minimal relief. The other day she notes a small pimple formed on the area. She was able to squeeze it and get a small amount of pus out but not much. Denies fever or chills. No hx of similar symptoms.  No hx of DM.     History reviewed. No pertinent past medical history. History reviewed. No pertinent surgical history. Family History  Problem Relation Age of Onset  . Hypertension Mother    Social History  Substance Use Topics  . Smoking status: Never Smoker  . Smokeless tobacco: Never Used  . Alcohol use 1.2 oz/week    2 Standard drinks or equivalent per week     Comment: not while pregnant    OB History    Gravida Para Term Preterm AB Living   3 2 2     2    SAB TAB Ectopic Multiple Live Births           1     Review of Systems  Constitutional: Negative for chills and fever.  Gastrointestinal: Negative for diarrhea and vomiting.  Musculoskeletal: Positive for myalgias. Negative for arthralgias and joint swelling.  Skin: Positive for color change, rash and wound.  Neurological: Negative for weakness and numbness.    Allergies  Patient has no known allergies.  Home Medications   Prior to Admission medications   Medication Sig Start Date End Date Taking? Authorizing Provider  ibuprofen (ADVIL,MOTRIN) 100  MG/5ML suspension Take 30 mLs (600 mg total) by mouth every 6 (six) hours. Patient not taking: Reported on 10/26/2016 09/10/16   Freddrick March, MD  medroxyPROGESTERone (DEPO-PROVERA) 150 MG/ML injection Inject 1 mL (150 mg total) into the muscle once. 01/25/17 01/25/17  Levie Heritage, DO  medroxyPROGESTERone (PROVERA) 10 MG tablet Take 2 tablets (20 mg total) by mouth daily. 11/13/16   Levie Heritage, DO  mupirocin ointment (BACTROBAN) 2 % Apply to area 3 times daily for 5 days. 02/28/17   Junius Finner, PA-C  senna-docusate (SENOKOT-S) 8.6-50 MG tablet Take 2 tablets by mouth daily. Patient not taking: Reported on 10/26/2016 09/11/16   Freddrick March, MD  sulfamethoxazole-trimethoprim (BACTRIM DS,SEPTRA DS) 800-160 MG tablet Take 1 tablet by mouth 2 (two) times daily. 02/28/17 03/10/17  Junius Finner, PA-C   Meds Ordered and Administered this Visit  Medications - No data to display  BP 116/88 (BP Location: Left Arm)   Pulse 88   Temp 97.9 F (36.6 C) (Oral)   Resp 16   Ht 5\' 7"  (1.702 m)   Wt 108 lb 12 oz (49.3 kg)   SpO2 95%   BMI 17.03 kg/m  No data found.   Physical Exam  Constitutional: She is oriented to person, place, and time. She appears well-developed and well-nourished.  HENT:  Head: Normocephalic and atraumatic.  Eyes: EOM are normal.  Neck:  Normal range of motion.  Cardiovascular: Normal rate.   Pulmonary/Chest: Effort normal.  Musculoskeletal: Normal range of motion. She exhibits tenderness. She exhibits no edema.       Legs: Right anterior thigh: tender (see skin exam)  Neurological: She is alert and oriented to person, place, and time.  Skin: Skin is warm and dry. There is erythema.  Right anterior thigh: 5x6cm area of erythema, warmth and induration. Tender. Centralized superficial scab. No active drainage or bleeding. No fluctuance.   Psychiatric: She has a normal mood and affect. Her behavior is normal.  Nursing note and vitals reviewed.   Urgent Care Course       Procedures (including critical care time)  Labs Review Labs Reviewed - No data to display  Imaging Review No results found.   MDM   1. Cellulitis of right thigh    Hx and exam c/w cellulitis. No indication for I&D at this time. Encouraged to continue warm compresses 2-3 times daily. May alternate acetaminophen and ibuprofen for pain.  Rx: Bactrim and mupirocin ointment.   F/u with PCP or return to UC if not improving in 3-4 days for recheck, sooner if significantly worsening.    Junius FinnerO'Malley, Kaulder Zahner, PA-C 02/28/17 1529

## 2017-02-28 NOTE — ED Triage Notes (Signed)
[facility] with C/o an insect bite one week ago today to the right upper leg. Possible spider bite. Right upper leg is swollen and red with a small blister noted.

## 2017-02-28 NOTE — ED Notes (Signed)
Bacitracin ointment applied to area and a dry sterile dressing also applied.

## 2017-03-02 ENCOUNTER — Encounter: Payer: Self-pay | Admitting: *Deleted

## 2017-03-02 ENCOUNTER — Telehealth: Payer: Self-pay | Admitting: *Deleted

## 2017-03-02 ENCOUNTER — Emergency Department (INDEPENDENT_AMBULATORY_CARE_PROVIDER_SITE_OTHER)
Admission: EM | Admit: 2017-03-02 | Discharge: 2017-03-02 | Disposition: A | Discharge status: Home / Self Care | Payer: BLUE CROSS/BLUE SHIELD | Source: Home / Self Care | Attending: Family Medicine | Admitting: Family Medicine

## 2017-03-02 DIAGNOSIS — L03115 Cellulitis of right lower limb: Secondary | ICD-10-CM

## 2017-03-02 LAB — POCT CBC W AUTO DIFF (K'VILLE URGENT CARE)

## 2017-03-02 MED ORDER — HYDROCODONE-ACETAMINOPHEN 5-325 MG PO TABS: ORAL_TABLET | ORAL | 0 refills | Status: DC

## 2017-03-02 MED ORDER — CLINDAMYCIN HCL 300 MG PO CAPS: 300.0000 mg | ORAL_CAPSULE | Freq: Three times a day (TID) | ORAL | 0 refills | Status: AC

## 2017-03-02 MED ORDER — IBUPROFEN 600 MG PO TABS
600.0000 mg | ORAL_TABLET | Freq: Once | ORAL | Status: AC
  Administered 2017-03-02: 200 mg via ORAL

## 2017-03-02 NOTE — Discharge Instructions (Signed)
Keep wound bandaged, clean and dry.  Change bandage daily and apply Bactroban ointment.  May apply heating pad 2 or 3 times daily.  May take Ibuprofen 200mg , 3 or 4 tabs every 8 hours with food.  If symptoms become significantly worse during the night or over the weekend, proceed to the local emergency room.

## 2017-03-02 NOTE — Telephone Encounter (Signed)
Callback: Patient reports her leg has not improved and is aching. I encouraged her to follow up later today for a full 48 hours on antibiotic or tomorrow.

## 2017-03-02 NOTE — ED Triage Notes (Signed)
Patient was seen 2 days ago for  Insect bite to her right upper thigh. She has been taking and applying antibiotics as prescribed. She reports the site is worsening and she is having aching in her leg.

## 2017-03-02 NOTE — ED Provider Notes (Addendum)
[facility]    CSN: [id number] Arrival date & time: 03/02/17  1559     History   Chief Complaint Chief Complaint  Patient presents with  . Wound Check    HPI Evelyn Cox is a 26 y.o. female.   Patient was treated two days earlier for insect bite/cellulitis to her right anterior thigh.  She has had four doses of Bactrim DS, and applying Bactroban ointment to lesion.  There has been no improvement and the lesion has become more painful and erythematous.  The center has been draining slightly.  No fevers, chills, and sweats.   The history is provided by the patient.    History reviewed. No pertinent past medical history.  There are no active problems to display for this patient.   History reviewed. No pertinent surgical history.  OB History    Gravida Para Term Preterm AB Living   3 2 2     2    SAB TAB Ectopic Multiple Live Births           1       Home Medications    Prior to Admission medications   Medication Sig Start Date End Date Taking? Authorizing Provider  clindamycin (CLEOCIN) 300 MG capsule Take 1 capsule (300 mg total) by mouth 3 (three) times daily. 03/02/17 03/12/17  Lattie Haw, MD  HYDROcodone-acetaminophen (NORCO/VICODIN) 5-325 MG tablet Take one by mouth at bedtime as needed for pain 03/02/17   Lattie Haw, MD  medroxyPROGESTERone (PROVERA) 10 MG tablet Take 2 tablets (20 mg total) by mouth daily. 11/13/16   Levie Heritage, DO  mupirocin ointment (BACTROBAN) 2 % Apply to area 3 times daily for 5 days. 02/28/17   Junius Finner, PA-C  senna-docusate (SENOKOT-S) 8.6-50 MG tablet Take 2 tablets by mouth daily. Patient not taking: Reported on 10/26/2016 09/11/16   Freddrick March, MD  sulfamethoxazole-trimethoprim (BACTRIM DS,SEPTRA DS) 800-160 MG tablet Take 1 tablet by mouth 2 (two) times daily. 02/28/17 03/10/17  Junius Finner, PA-C    Family History Family History  Problem Relation Age of Onset  . Hypertension Mother      Social History Social History  Substance Use Topics  . Smoking status: Never Smoker  . Smokeless tobacco: Never Used  . Alcohol use 1.2 oz/week    2 Standard drinks or equivalent per week     Comment: not while pregnant      Allergies   Patient has no known allergies.   Review of Systems Review of Systems  Constitutional: Negative for activity change, appetite change, chills, diaphoresis, fatigue and fever.  Skin: Positive for color change and wound.  All other systems reviewed and are negative.    Physical Exam Triage Vital Signs ED Triage Vitals  Enc Vitals Group     BP 03/02/17 1613 103/73     Pulse Rate 03/02/17 1613 85     Resp --      Temp 03/02/17 1613 98.5 F (36.9 C)     Temp Source 03/02/17 1613 Oral     SpO2 03/02/17 1613 99 %     Weight --      Height --      Head Circumference --      Peak Flow --      Pain Score 03/02/17 1614 10     Pain Loc --      Pain Edu? --      Excl. in GC? --    No data found.  Updated Vital Signs BP 103/73 (BP Location: Left Arm)   Pulse 85   Temp 98.5 F (36.9 C) (Oral)   LMP 03/02/2017   SpO2 99%   Visual Acuity Right Eye Distance:   Left Eye Distance:   Bilateral Distance:    Right Eye Near:   Left Eye Near:    Bilateral Near:     Physical Exam  Constitutional: She appears well-developed and well-nourished. No distress.  Eyes: Pupils are equal, round, and reactive to light.  Cardiovascular: Normal rate.   Pulmonary/Chest: Effort normal.  Musculoskeletal:       Right upper leg: She exhibits tenderness and swelling.       Legs: Right anterior thigh has erythematous, tender, indurated area with central excoriation as noted on diagram.  Area is not fluctuant.  Neurological: She is alert.  Skin: Skin is warm.  Nursing note and vitals reviewed.      UC Treatments / Results  Labs (all labs ordered are listed, but only abnormal results are displayed) Labs Reviewed  WOUND CULTURE  POCT CBC W  AUTO DIFF ([facility]):  WBC 8.2; LY 25.8; MO 10.2; GR 64.0; Hgb 12.2; Platelets 320     EKG  EKG Interpretation None       Radiology No results found.  Procedures Procedures (including critical care time)  Medications Ordered in UC Medications  ibuprofen (ADVIL,MOTRIN) tablet 600 mg (200 mg Oral Given 03/02/17 1635)     Initial Impression / Assessment and Plan / UC Course  I have reviewed the triage vital signs and the nursing notes.  Pertinent labs & imaging results that were available during my care of the patient were reviewed by me and considered in my medical decision making (see chart for details).    Normal WBC reassuring. Wound culture pending. Will switch to Clindamycin (stop Bactrim DS) Keep wound bandaged, clean and dry.  Change bandage daily and apply Bactroban ointment.  May apply heating pad 2 or 3 times daily.  May take Ibuprofen 200mg , 3 or 4 tabs every 8 hours with food.  If symptoms become significantly worse during the night or over the weekend, proceed to the local emergency room.  Return for follow-up if becomes more fluctuant (would need I and D).    Final Clinical Impressions(s) / UC Diagnoses   Final diagnoses:  Cellulitis of right thigh    New Prescriptions New Prescriptions   CLINDAMYCIN (CLEOCIN) 300 MG CAPSULE    Take 1 capsule (300 mg total) by mouth 3 (three) times daily.   HYDROCODONE-ACETAMINOPHEN (NORCO/VICODIN) 5-325 MG TABLET    Take one by mouth at bedtime as needed for pain     Lattie HawBeese, Stephen A, MD 03/02/17 1702    Lattie HawBeese, Stephen A, MD 03/02/17 1704

## 2017-03-03 ENCOUNTER — Encounter: Payer: Self-pay | Admitting: *Deleted

## 2017-03-03 ENCOUNTER — Emergency Department (INDEPENDENT_AMBULATORY_CARE_PROVIDER_SITE_OTHER)
Admission: EM | Admit: 2017-03-03 | Discharge: 2017-03-03 | Disposition: A | Discharge status: Home / Self Care | Payer: BLUE CROSS/BLUE SHIELD | Source: Home / Self Care | Attending: Family Medicine | Admitting: Family Medicine

## 2017-03-03 DIAGNOSIS — Z5189 Encounter for other specified aftercare: Secondary | ICD-10-CM

## 2017-03-03 MED ORDER — IBUPROFEN 200 MG PO TABS
200.0000 mg | ORAL_TABLET | Freq: Once | ORAL | Status: AC
  Administered 2017-03-03: 200 mg via ORAL

## 2017-03-03 NOTE — ED Triage Notes (Signed)
Pt is here today for a recheck of her RT thigh abscess.

## 2017-03-03 NOTE — Discharge Instructions (Signed)
°  Please make sure you complete the entire course of your antibiotics.  You may take acetaminophen 500mg  and/or ibuprofen 400mg  (children's liquid or chewable) for pain.    You may remove the bandage while in the shower and gently massage the area. Be sure to pat dry the area before applying the antibiotic ointment then a new dry bandage.  You should change your bandage 2-3 times daily, sooner if your wound is draining a lot or if the bandage gets wet or dirty.

## 2017-03-03 NOTE — ED Provider Notes (Signed)
CSN: [id number]     Arrival date & time 03/03/17  [id number] History   First MD Initiated Contact with Patient 03/03/17 0920     Chief Complaint  Patient presents with  . Wound Check   (Consider location/radiation/quality/duration/timing/severity/associated sxs/prior Treatment) HPI Evelyn Cox is a 26 y.o. female presenting to UC for a wound recheck of an abscess.  [facility], then again yesterday due to worsening pain and increased drainage. Her antibiotic was changed from Bactrim to Clindamycin. Today, pt notes there is more drainage but pain is improving.  Pain is mild at this time. No pain medication taken PTA. Denies fever or chills.    History reviewed. No pertinent past medical history. History reviewed. No pertinent surgical history. Family History  Problem Relation Age of Onset  . Hypertension Mother    Social History  Substance Use Topics  . Smoking status: Never Smoker  . Smokeless tobacco: Never Used  . Alcohol use 1.2 oz/week    2 Standard drinks or equivalent per week     Comment: not while pregnant    OB History    Gravida Para Term Preterm AB Living   3 2 2     2    SAB TAB Ectopic Multiple Live Births           1     Review of Systems  Constitutional: Negative for chills and fever.  Gastrointestinal: Negative for nausea and vomiting.  Skin: Positive for color change and wound.    Allergies  Patient has no known allergies.  Home Medications   Prior to Admission medications   Medication Sig Start Date End Date Taking? Authorizing Provider  clindamycin (CLEOCIN) 300 MG capsule Take 1 capsule (300 mg total) by mouth 3 (three) times daily. 03/02/17 03/12/17  Lattie HawBeese, Stephen A, MD  HYDROcodone-acetaminophen (NORCO/VICODIN) 5-325 MG tablet Take one by mouth at bedtime as needed for pain 03/02/17   Lattie HawBeese, Stephen A, MD  medroxyPROGESTERone (PROVERA) 10 MG tablet Take 2 tablets (20 mg total) by mouth daily. 11/13/16   Levie HeritageStinson, Jacob J, DO    mupirocin ointment (BACTROBAN) 2 % Apply to area 3 times daily for 5 days. 02/28/17   Junius Finner'Malley, Lamar Naef, PA-C  senna-docusate (SENOKOT-S) 8.6-50 MG tablet Take 2 tablets by mouth daily. Patient not taking: Reported on 10/26/2016 09/11/16   Freddrick MarchAmin, Yashika, MD  sulfamethoxazole-trimethoprim (BACTRIM DS,SEPTRA DS) 800-160 MG tablet Take 1 tablet by mouth 2 (two) times daily. 02/28/17 03/10/17  Junius Finner'Malley, Dantae Meunier, PA-C   Meds Ordered and Administered this Visit  Medications - No data to display  BP 106/76 (BP Location: Left Arm)   Pulse 72   Temp 98 F (36.7 C) (Oral)   Resp 16   LMP 03/02/2017   SpO2 100%  No data found.   Physical Exam  Constitutional: She is oriented to person, place, and time. She appears well-developed and well-nourished. No distress.  HENT:  Head: Normocephalic and atraumatic.  Eyes: EOM are normal.  Neck: Normal range of motion.  Cardiovascular: Normal rate.   Pulmonary/Chest: Effort normal.  Musculoskeletal: Normal range of motion.  Neurological: She is alert and oriented to person, place, and time.  Skin: Skin is warm and dry. She is not diaphoretic. There is erythema.     Abscess to Right thigh: abscess is spontaneously draining. Mild fluctuance with surrounding erythema and induration.   Psychiatric: She has a normal mood and affect. Her behavior is normal.  Nursing note and vitals reviewed.  Urgent Care Course     Procedures (including critical care time)  Labs Review Labs Reviewed - No data to display  Imaging Review No results found.  Bandage removed. Abscess spontaneously draining. Wound gently massaged to express more pus and blood. Moderate amount of drainage obtained. No packing placed. Bacitracin and new clean bandage applied.    MDM   1. Wound check, abscess    Continue antibiotics as prescribed. Change bandage at least 2-3 times daily, sooner if it becomes wet or dirty.  F/u in 3-4 days if not improving, sooner if significantly worsening,  otherwise, no need to f/u.  May take acetaminophen and ibuprofen for pain.    Junius Finner, PA-C 03/03/17 [id number]

## 2017-03-04 ENCOUNTER — Telehealth: Payer: Self-pay | Admitting: Emergency Medicine

## 2017-03-04 NOTE — Telephone Encounter (Signed)
No particular bacteria found, continue ABT and cream.

## 2017-03-08 ENCOUNTER — Telehealth: Payer: Self-pay | Admitting: *Deleted

## 2017-03-08 LAB — WOUND CULTURE
GRAM STAIN: NONE SEEN
GRAM STAIN: NONE SEEN

## 2017-03-08 NOTE — Telephone Encounter (Signed)
Callback: No answer, LMOM f/u from visit. WCX sensitive to antibiotic prescribed.

## 2017-03-24 ENCOUNTER — Telehealth: Payer: Self-pay | Admitting: Emergency Medicine

## 2017-03-24 NOTE — Telephone Encounter (Signed)
Patient called wound is not responding to ABT, not getting better. As per Dr Cathren HarshBeese, see Dermatology might need biopsy.Patient ackwowledges understanding. I gave [facility] the number to [facility]

## 2017-04-27 ENCOUNTER — Ambulatory Visit: Payer: BLUE CROSS/BLUE SHIELD

## 2017-04-28 ENCOUNTER — Ambulatory Visit: Payer: BLUE CROSS/BLUE SHIELD

## 2017-04-30 ENCOUNTER — Ambulatory Visit (INDEPENDENT_AMBULATORY_CARE_PROVIDER_SITE_OTHER): Payer: BLUE CROSS/BLUE SHIELD

## 2017-04-30 DIAGNOSIS — Z3042 Encounter for surveillance of injectable contraceptive: Secondary | ICD-10-CM | POA: Diagnosis not present

## 2017-04-30 DIAGNOSIS — Z309 Encounter for contraceptive management, unspecified: Secondary | ICD-10-CM

## 2017-04-30 NOTE — Progress Notes (Signed)
Patient is here for Depo Provera and didn't pick up her prescription and bring with her. Used stock from our cabinet. Patient is wanting to switch birth controls. Patient given hand out on Nexplanon, IUD and nuvaring.

## 2017-06-18 ENCOUNTER — Ambulatory Visit: Payer: BLUE CROSS/BLUE SHIELD | Admitting: Family Medicine

## 2017-06-18 DIAGNOSIS — Z3049 Encounter for surveillance of other contraceptives: Secondary | ICD-10-CM

## 2017-07-22 ENCOUNTER — Other Ambulatory Visit: Payer: Self-pay

## 2017-07-22 MED ORDER — MEDROXYPROGESTERONE ACETATE 150 MG/ML IM SUSP: 150.0000 mg | INTRAMUSCULAR | 2 refills | Status: DC

## 2017-07-22 NOTE — Telephone Encounter (Signed)
Refill on Depo Provera sent to [facility] and patient schedule for next depo provera on 07-30-17. Armandina Stammer RNBSN

## 2017-07-30 ENCOUNTER — Encounter: Payer: Self-pay | Admitting: Family Medicine

## 2017-07-30 ENCOUNTER — Ambulatory Visit (INDEPENDENT_AMBULATORY_CARE_PROVIDER_SITE_OTHER): Payer: BLUE CROSS/BLUE SHIELD

## 2017-07-30 DIAGNOSIS — Z3042 Encounter for surveillance of injectable contraceptive: Secondary | ICD-10-CM | POA: Diagnosis not present

## 2017-07-30 MED ORDER — MEDROXYPROGESTERONE ACETATE 150 MG/ML IM SUSP: 150.0000 mg | Freq: Once | INTRAMUSCULAR | Status: DC

## 2017-07-30 MED FILL — medroxyPROGESTERone ACETATE: 150 | 84 days supply | Qty: 1 | Fill #0

## 2017-11-19 ENCOUNTER — Ambulatory Visit: Payer: BLUE CROSS/BLUE SHIELD | Admitting: Family Medicine

## 2017-12-30 ENCOUNTER — Ambulatory Visit: Payer: BLUE CROSS/BLUE SHIELD | Admitting: Family Medicine

## 2017-12-31 ENCOUNTER — Ambulatory Visit: Payer: BLUE CROSS/BLUE SHIELD | Admitting: Family Medicine

## 2018-01-25 ENCOUNTER — Emergency Department (HOSPITAL_BASED_OUTPATIENT_CLINIC_OR_DEPARTMENT_OTHER)
Admission: EM | Admit: 2018-01-25 | Discharge: 2018-01-25 | Disposition: A | Discharge status: Home / Self Care | Payer: BLUE CROSS/BLUE SHIELD | Attending: Emergency Medicine | Admitting: Emergency Medicine

## 2018-01-25 ENCOUNTER — Encounter (HOSPITAL_BASED_OUTPATIENT_CLINIC_OR_DEPARTMENT_OTHER): Payer: Self-pay

## 2018-01-25 DIAGNOSIS — R350 Frequency of micturition: Secondary | ICD-10-CM | POA: Diagnosis present

## 2018-01-25 DIAGNOSIS — N3000 Acute cystitis without hematuria: Secondary | ICD-10-CM | POA: Insufficient documentation

## 2018-01-25 DIAGNOSIS — Z79899 Other long term (current) drug therapy: Secondary | ICD-10-CM | POA: Insufficient documentation

## 2018-01-25 LAB — URINALYSIS, ROUTINE W REFLEX MICROSCOPIC
GLUCOSE, UA: 100 mg/dL — AB
KETONES UR: NEGATIVE mg/dL
NITRITE: POSITIVE — AB
PH: 7 (ref 5.0–8.0)
Protein, ur: 100 mg/dL — AB
Specific Gravity, Urine: 1.025 (ref 1.005–1.030)

## 2018-01-25 LAB — URINALYSIS, MICROSCOPIC (REFLEX)

## 2018-01-25 LAB — PREGNANCY, URINE: Preg Test, Ur: NEGATIVE

## 2018-01-25 MED ORDER — SULFAMETHOXAZOLE-TRIMETHOPRIM 800-160 MG PO TABS
1.0000 | ORAL_TABLET | Freq: Once | ORAL | Status: AC
  Administered 2018-01-25: 1 via ORAL
  Filled 2018-01-25: qty 1

## 2018-01-25 MED ORDER — PHENAZOPYRIDINE HCL 100 MG PO TABS
100.0000 mg | ORAL_TABLET | Freq: Once | ORAL | Status: AC
  Administered 2018-01-25: 100 mg via ORAL
  Filled 2018-01-25: qty 1

## 2018-01-25 MED ORDER — PHENAZOPYRIDINE HCL 200 MG PO TABS: 200.0000 mg | ORAL_TABLET | Freq: Three times a day (TID) | ORAL | 0 refills | Status: DC

## 2018-01-25 MED ORDER — SULFAMETHOXAZOLE-TRIMETHOPRIM 800-160 MG PO TABS: 1.0000 | ORAL_TABLET | Freq: Two times a day (BID) | ORAL | 0 refills | Status: AC

## 2018-01-25 MED FILL — SULFAMETHOXAZOLE-TMP DS TAB: 800-160 | 3 days supply | Qty: 6 | Fill #0

## 2018-01-25 NOTE — Discharge Instructions (Addendum)
Pyridium to decrease pain.  Will make your urine orange.  You do not have to complete entire course of treatment.  Use only as needed.  Bactrim twice per day for 3 days.

## 2018-01-25 NOTE — ED Provider Notes (Signed)
[facility] EMERGENCY DEPARTMENT Provider Note   CSN: [id number] Arrival date & time: 01/25/18  1006     History   Chief Complaint Chief Complaint  Patient presents with  . Urinary Frequency    HPI Evelyn Cox is a 27 y.o. female.  Chief complaint is burning with urination.  Urinary frequency.  HPI 27 year old female.  History of previous UTIs.  Symptomatic for 2 days.  Urinary frequency burning.  No gross hematuria.  Denies pregnancy or discharge.  No back pain, flank pain, nausea or vomiting.  History reviewed. No pertinent past medical history.  There are no active problems to display for this patient.   History reviewed. No pertinent surgical history.   OB History    Gravida  3   Para  2   Term  2   Preterm      AB      Living  2     SAB      TAB      Ectopic      Multiple      Live Births  1            Home Medications    Prior to Admission medications   Medication Sig Start Date End Date Taking? Authorizing Provider  HYDROcodone-acetaminophen (NORCO/VICODIN) 5-325 MG tablet Take one by mouth at bedtime as needed for pain 03/02/17   Lattie Haw, MD  medroxyPROGESTERone (DEPO-PROVERA) 150 MG/ML injection Inject 1 mL (150 mg total) into the muscle every 3 (three) months. 07/22/17   Levie Heritage, DO  medroxyPROGESTERone (PROVERA) 10 MG tablet Take 2 tablets (20 mg total) by mouth daily. 11/13/16   Levie Heritage, DO  mupirocin ointment (BACTROBAN) 2 % Apply to area 3 times daily for 5 days. 02/28/17   Lurene Shadow, PA-C  phenazopyridine (PYRIDIUM) 200 MG tablet Take 1 tablet (200 mg total) by mouth 3 (three) times daily. 01/25/18   Rolland Porter, MD  senna-docusate (SENOKOT-S) 8.6-50 MG tablet Take 2 tablets by mouth daily. Patient not taking: Reported on 10/26/2016 09/11/16   Freddrick March, MD  sulfamethoxazole-trimethoprim (BACTRIM DS,SEPTRA DS) 800-160 MG tablet Take 1 tablet by mouth 2 (two) times daily for 3 days. 01/25/18  01/28/18  Rolland Porter, MD    Family History Family History  Problem Relation Age of Onset  . Hypertension Mother     Social History Social History   Tobacco Use  . Smoking status: Never Smoker  . Smokeless tobacco: Never Used  Substance Use Topics  . Alcohol use: Yes    Alcohol/week: 1.2 oz    Types: 2 Standard drinks or equivalent per week    Comment: not while pregnant   . Drug use: No     Allergies   Patient has no known allergies.   Review of Systems Review of Systems  Constitutional: Negative for appetite change, chills, diaphoresis, fatigue and fever.  HENT: Negative for mouth sores, sore throat and trouble swallowing.   Eyes: Negative for visual disturbance.  Respiratory: Negative for cough, chest tightness, shortness of breath and wheezing.   Cardiovascular: Negative for chest pain.  Gastrointestinal: Negative for abdominal distention, abdominal pain, diarrhea, nausea and vomiting.  Endocrine: Negative for polydipsia, polyphagia and polyuria.  Genitourinary: Positive for dysuria, frequency and urgency. Negative for hematuria.  Musculoskeletal: Negative for gait problem.  Skin: Negative for color change, pallor and rash.  Neurological: Negative for dizziness, syncope, light-headedness and headaches.  Hematological: Does not bruise/bleed easily.  Psychiatric/Behavioral:  Negative for behavioral problems and confusion.     Physical Exam Updated Vital Signs BP 113/78 (BP Location: Left Arm)   Pulse 69   Temp 98.1 F (36.7 C) (Oral)   Resp 16   Ht 5\' 7"  (1.702 m)   Wt 48.9 kg (107 lb 12.8 oz)   LMP  (LMP Unknown)   SpO2 100%   BMI 16.88 kg/m   Physical Exam  Constitutional: She is oriented to person, place, and time. She appears well-developed and well-nourished. No distress.  HENT:  Head: Normocephalic.  Eyes: Pupils are equal, round, and reactive to light. Conjunctivae are normal. No scleral icterus.  Neck: Normal range of motion. Neck supple. No  thyromegaly present.  Cardiovascular: Normal rate and regular rhythm. Exam reveals no gallop and no friction rub.  No murmur heard. Pulmonary/Chest: Effort normal and breath sounds normal. No respiratory distress. She has no wheezes. She has no rales.  Abdominal: Soft. Bowel sounds are normal. She exhibits no distension. There is no tenderness. There is no rebound.  Musculoskeletal: Normal range of motion.  Neurological: She is alert and oriented to person, place, and time.  Skin: Skin is warm and dry. No rash noted.  Psychiatric: She has a normal mood and affect. Her behavior is normal.     ED Treatments / Results  Labs (all labs ordered are listed, but only abnormal results are displayed) Labs Reviewed  URINALYSIS, ROUTINE W REFLEX MICROSCOPIC - Abnormal; Notable for the following components:      Result Value   Color, Urine BROWN (*)    APPearance TURBID (*)    Glucose, UA 100 (*)    Hgb urine dipstick LARGE (*)    Bilirubin Urine SMALL (*)    Protein, ur 100 (*)    Nitrite POSITIVE (*)    Leukocytes, UA SMALL (*)    All other components within normal limits  URINALYSIS, MICROSCOPIC (REFLEX) - Abnormal; Notable for the following components:   Bacteria, UA MANY (*)    Squamous Epithelial / LPF 6-30 (*)    All other components within normal limits  PREGNANCY, URINE    EKG None  Radiology No results found.  Procedures Procedures (including critical care time)  Medications Ordered in ED Medications  sulfamethoxazole-trimethoprim (BACTRIM DS,SEPTRA DS) 800-160 MG per tablet 1 tablet (has no administration in time range)  phenazopyridine (PYRIDIUM) tablet 100 mg (has no administration in time range)     Initial Impression / Assessment and Plan / ED Course  I have reviewed the triage vital signs and the nursing notes.  Pertinent labs & imaging results that were available during my care of the patient were reviewed by me and considered in my medical decision making  (see chart for details).    Pregnancy test positive urine.  Given Pyridium Bactrim.  Encourage p.o. intake.  Primary care follow-up.  ER if not improving.  Final Clinical Impressions(s) / ED Diagnoses   Final diagnoses:  Acute cystitis without hematuria    ED Discharge Orders        Ordered    sulfamethoxazole-trimethoprim (BACTRIM DS,SEPTRA DS) 800-160 MG tablet  2 times daily     01/25/18 1132    phenazopyridine (PYRIDIUM) 200 MG tablet  3 times daily     01/25/18 1132       Rolland PorterJames, Jaivian Battaglini, MD 01/25/18 1134

## 2018-01-25 NOTE — ED Triage Notes (Signed)
Pt c/o uti s/s since Sunday, worse today; took azo with no relief

## 2018-03-06 IMAGING — US US MFM OB COMP +14 WKS
1 series · 14 of 28 positions shown · non-contrast
Comparison: none

[Series 1: us mfm ob comp +14 wks · 95 acquisitions, 14 frames shown]
[im 4/95]
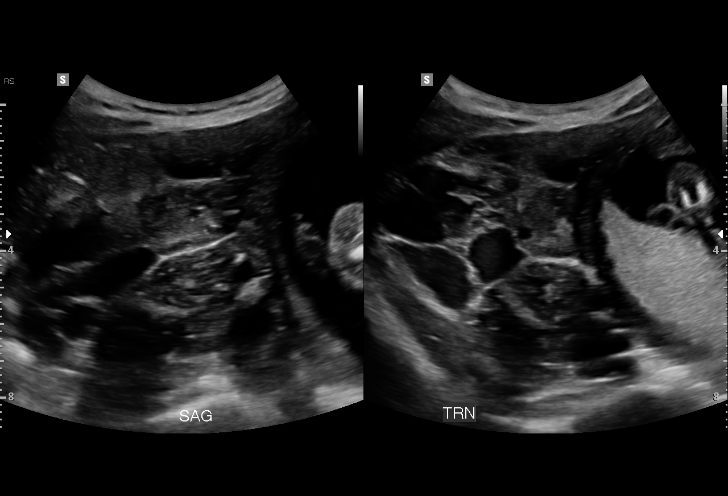
[im 11/95]
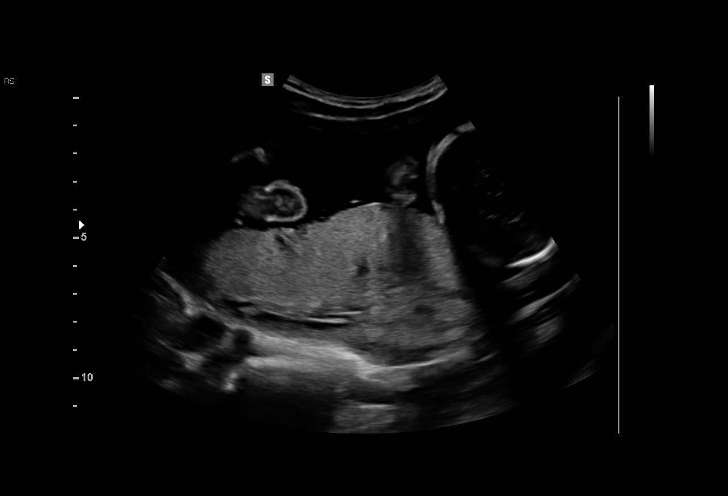
[im 18/95]
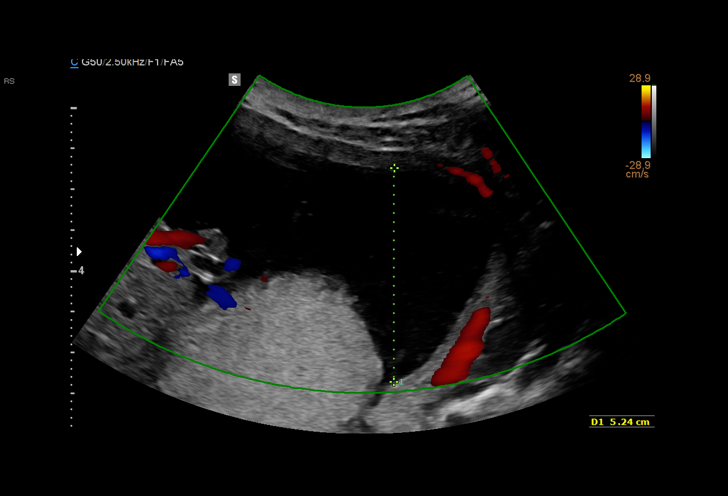
[im 25/95]
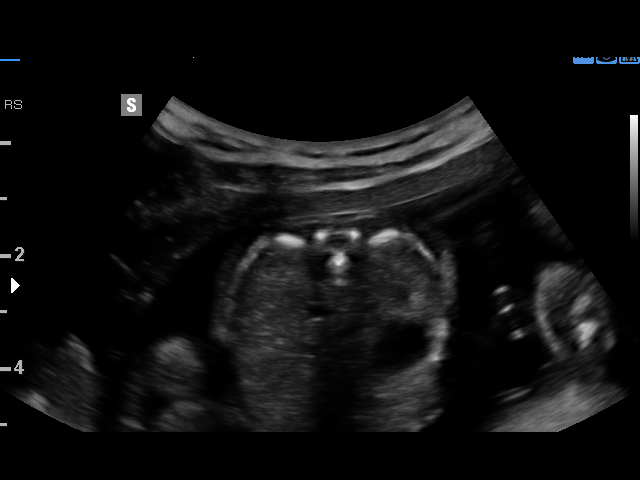
[im 32/95]
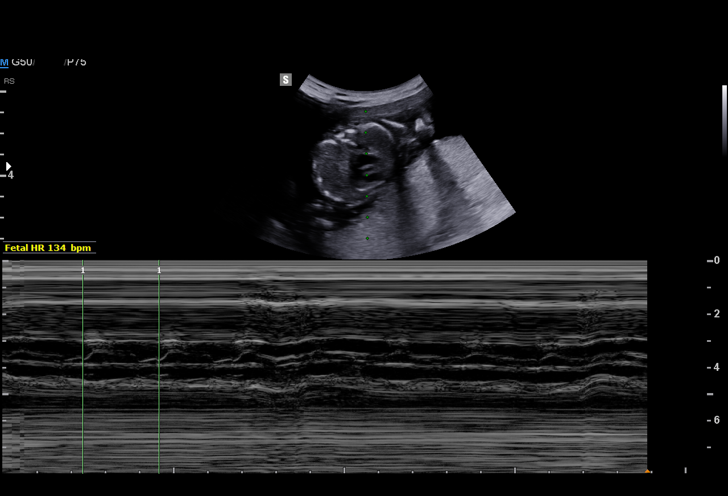
[im 39/95]
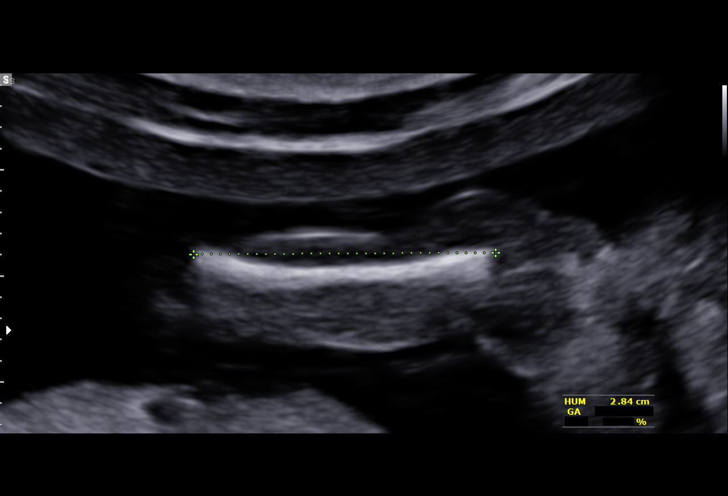
[im 46/95]
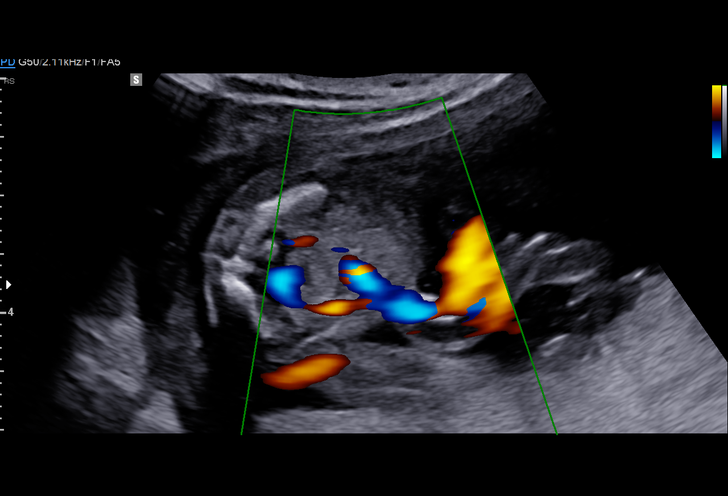
[im 53/95]
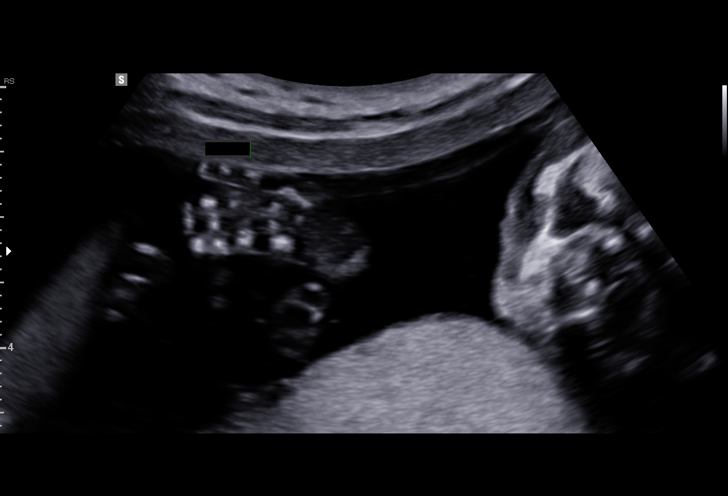
[im 60/95]
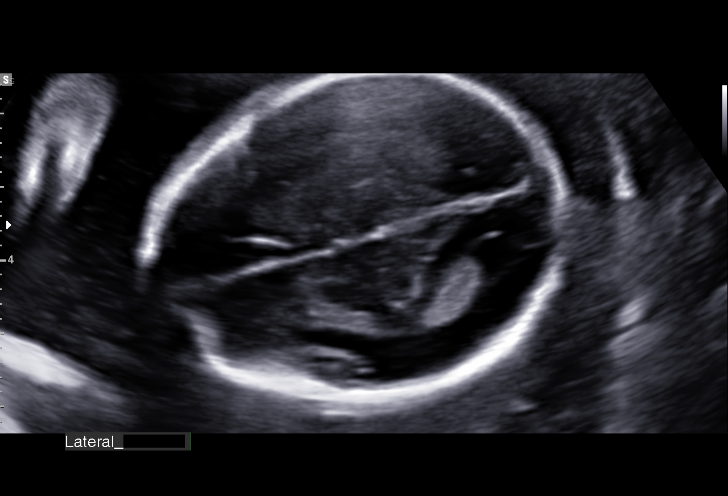
[im 67/95]
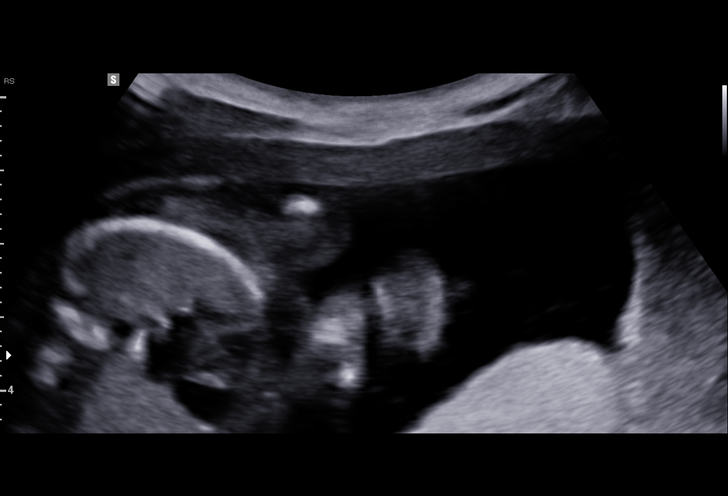
[im 74/95]
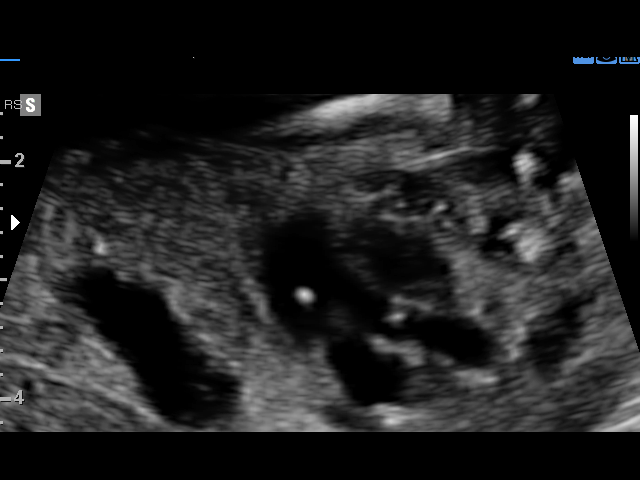
[im 81/95]
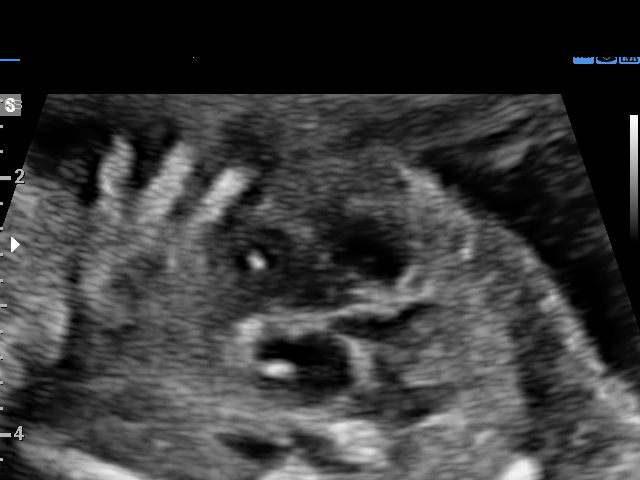
[im 88/95]
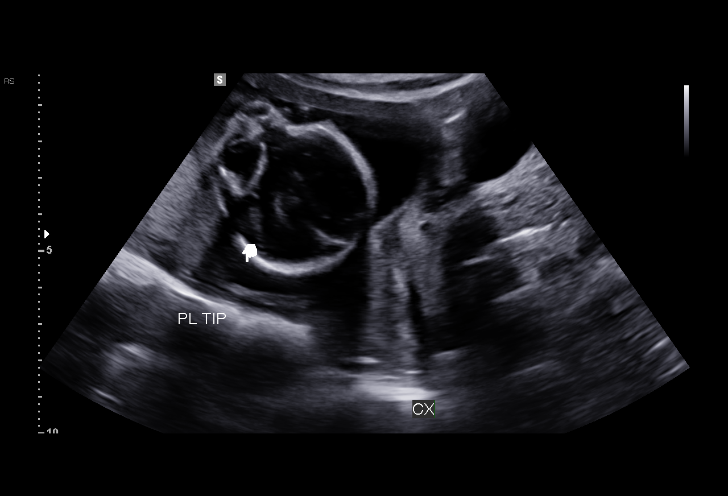
[im 95/95]
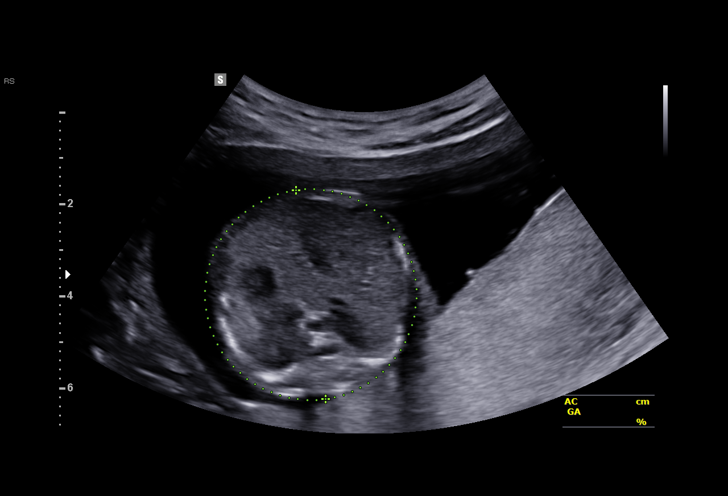

[14 of 28 positions shown; findings below may reference images not displayed]

CHAI

DEYTER
Indications

19 weeks gestation of pregnancy
Basic anatomic survey                          Z36
OB History

Gravidity:    3         Term:   2        Prem:   0        SAB:   0
TOP:          0       Ectopic:  0        Living: 2
Fetal Evaluation

Num Of Fetuses:     1
Fetal Heart         134
Rate(bpm):
Cardiac Activity:   Observed
Presentation:       Variable
Placenta:           Posterior, above cervical os
P. Cord Insertion:  Visualized, central

Amniotic Fluid
AFI FV:      Subjectively within normal limits

Largest Pocket(cm)
5.24
Biometry

BPD:      43.9  mm     G. Age:  19w 2d         50  %    CI:        69.92   %   70 - 86
FL/HC:      17.4   %   16.1 -
HC:      167.5  mm     G. Age:  19w 3d         49  %    HC/AC:      1.17       1.09 -
AC:      143.2  mm     G. Age:  19w 4d         58  %    FL/BPD:     66.3   %
FL:       29.1  mm     G. Age:  19w 0d         32  %    FL/AC:      20.3   %   20 - 24
HUM:      28.2  mm     G. Age:  19w 1d         46  %
CER:      20.7  mm     G. Age:  19w 5d         58  %
NFT:       3.6  mm

CM:        2.5  mm
Est. FW:     287  gm    0 lb 10 oz      47  %
Gestational Age

LMP:           19w 2d       Date:   01/01/16                 EDD:   10/07/16
U/S Today:     19w 2d                                        EDD:   10/07/16
Best:          19w 2d    Det. By:   LMP  (01/01/16)          EDD:   10/07/16
Anatomy

Cranium:               Appears normal         Aortic Arch:            Appears normal
Cavum:                 Appears normal         Ductal Arch:            Appears normal
Ventricles:            Appears normal         Diaphragm:              Appears normal
Choroid Plexus:        Appears normal         Stomach:                Appears normal, left
sided
Cerebellum:            Appears normal         Abdomen:                Appears normal
Posterior Fossa:       Appears normal         Abdominal Wall:         Appears nml (cord
insert, abd wall)
Nuchal Fold:           Appears normal         Cord Vessels:           Appears normal (3
vessel cord)
Face:                  Orbits appear          Kidneys:                Appear normal
normal
Lips:                  Appears normal         Bladder:                Appears normal
Thoracic:              Appears normal         Spine:                  Appears normal
Heart:                 Echogenic focus        Upper Extremities:      Appears normal
in LV
RVOT:                  Not well visualized    Lower Extremities:      Appears normal
LVOT:                  Appears normal

Other:  Parents do not wish to know sex of fetus. (male gender). Heels and
5th digit visualized.
Cervix Uterus Adnexa

Cervix
Length:           4.01  cm.
Normal appearance by transabdominal scan.

Uterus
No abnormality visualized.

Left Ovary
Within normal limits.

Right Ovary
Within normal limits.

Adnexa:       No abnormality visualized. No free fluid.
Comments

An echogenic focus was seen in the left cardiac ventricle.
However, no gross fetal anomalies or other soft markers of
aneuploidy were identified.   An echogenic intracardiac focus
is felt to represent a calcified papillary muscle, and is not
associated with structural or functional cardiac abnormalities.
Although an echogenic cardiac focus may be associated with
an increased risk of Down syndrome, this risk is felt to be
minimal, especially when it is seen as an isolated finding.
Impression

Single IUP at 19w 2d
An echogenic intracardiac focus was noted in the left
ventricle (see comments)
Limited views of the fetal heart obtained (RVOT)
The remainder of the fetal anatomy appears normal
Ultrasound measurements are consistent with LMP
Posterior placenta without previa
Normal amniotic fluid volume
Recommendations

Recommend follow-up ultrasound examination in 4 weeks to
reevaluate the fetal heart

## 2018-06-30 IMAGING — US US MFM FETAL BPP W/O NON-STRESS
1 series · 10 of 10 positions shown · non-contrast
Comparison: none

[Series 1: us mfm fetal bpp w/o non-stress · 10 of 10 slices shown]
[im 1/10]
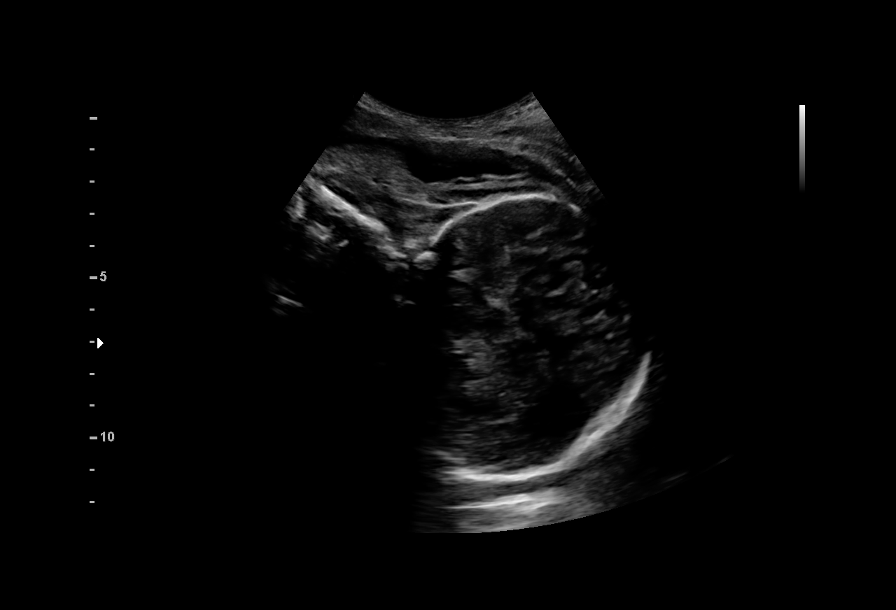
[im 2/10]
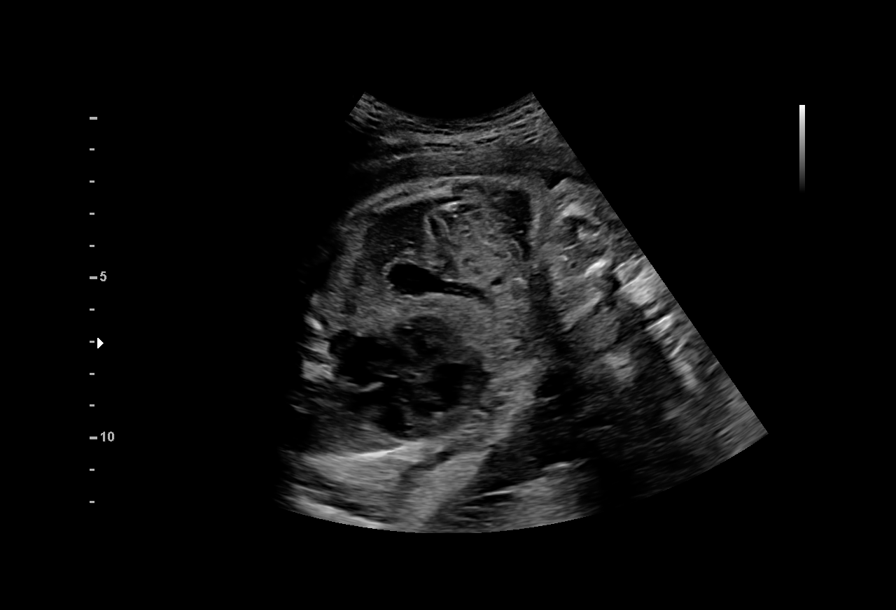
[im 3/10]
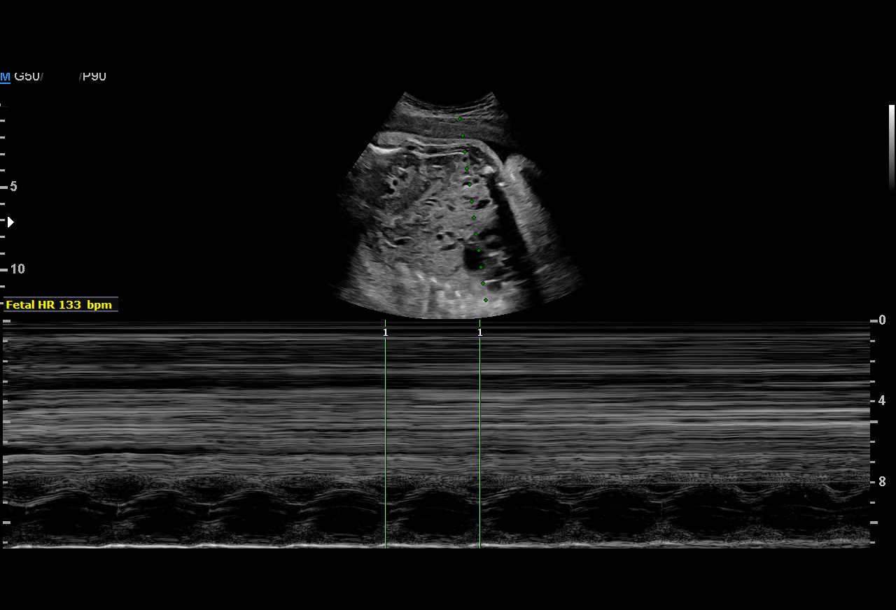
[im 4/10]
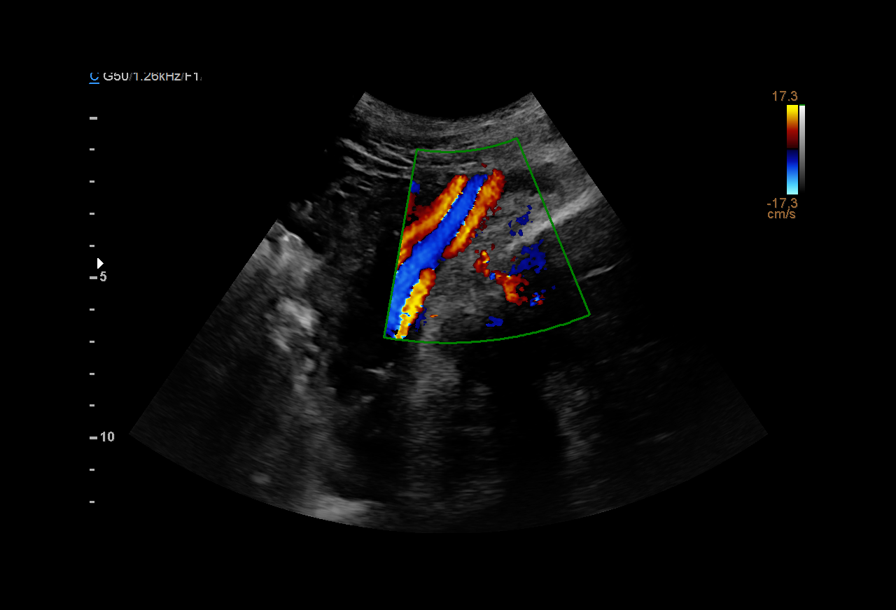
[im 5/10]
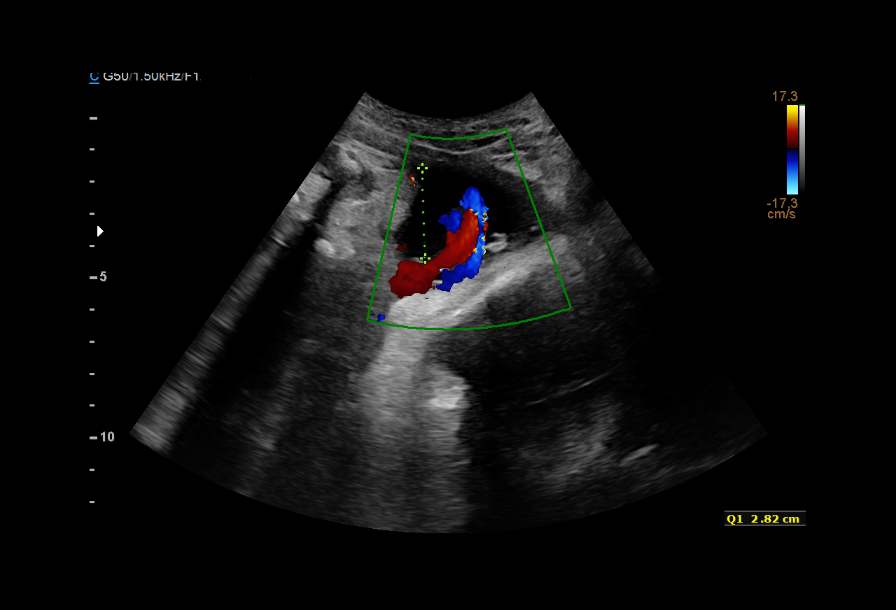
[im 6/10]
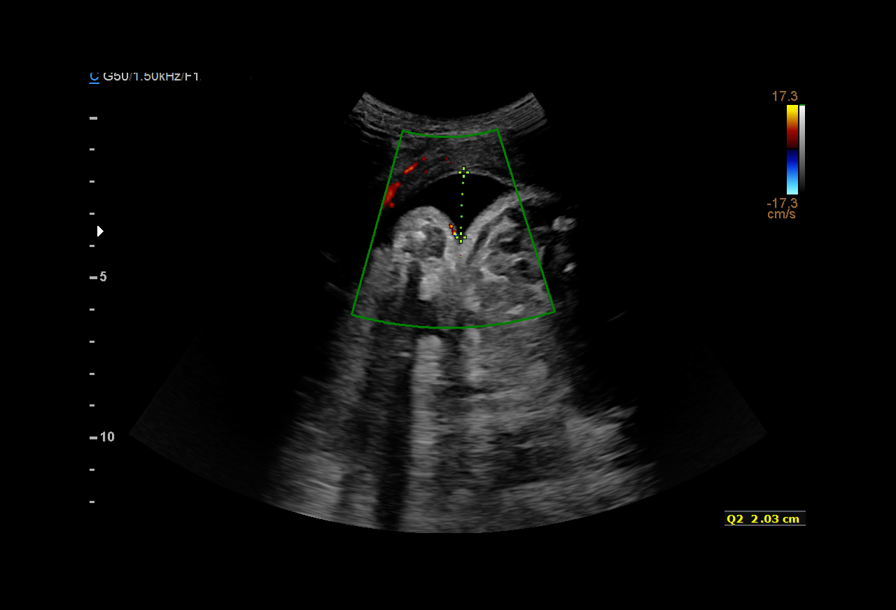
[im 7/10]
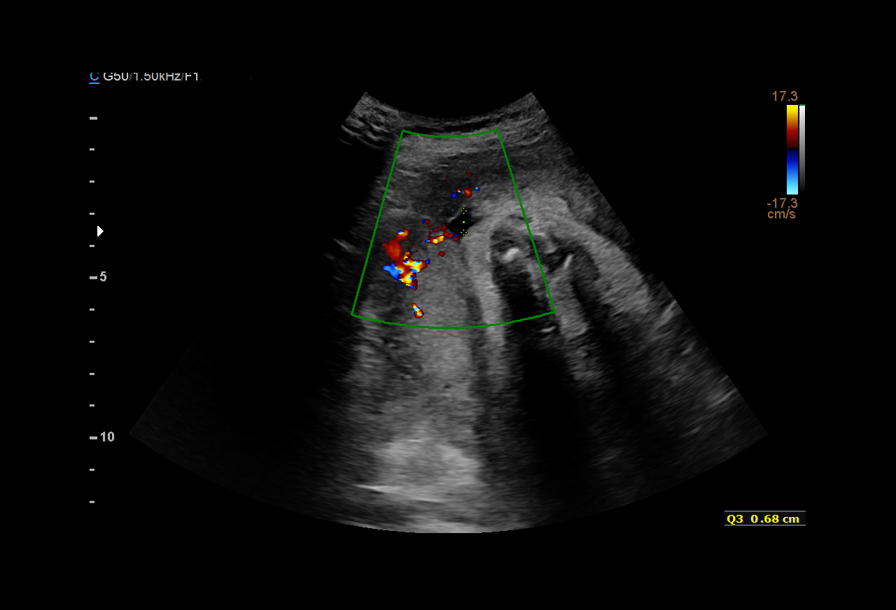
[im 8/10]
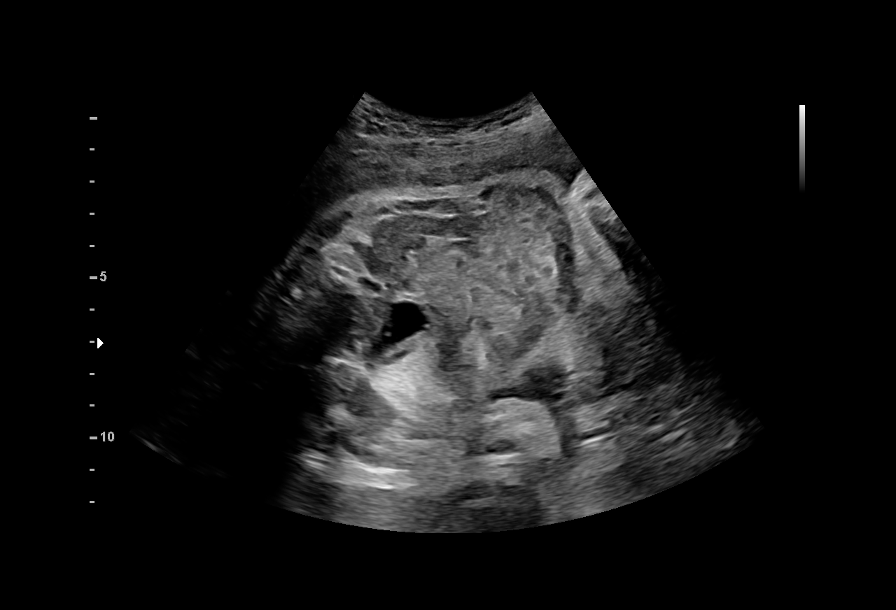
[im 9/10]
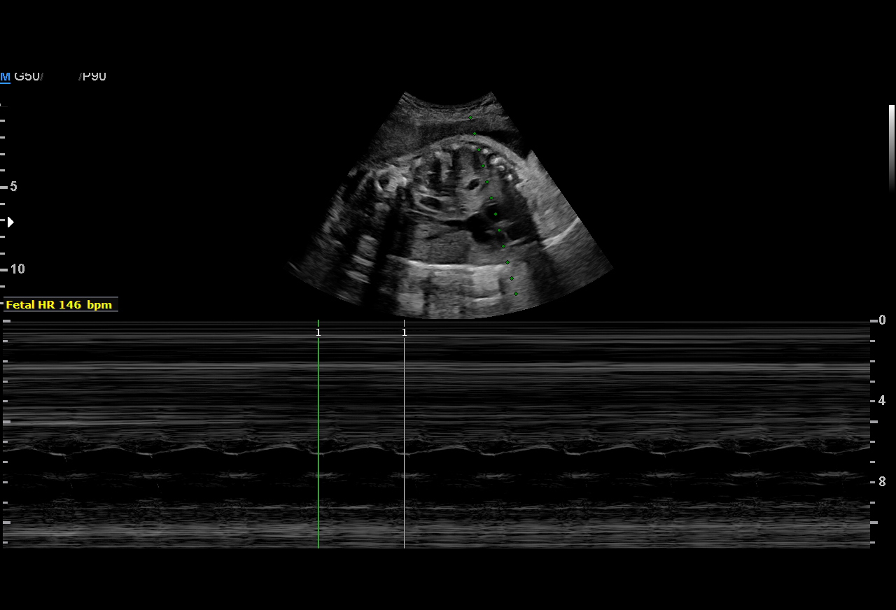
[im 10/10]
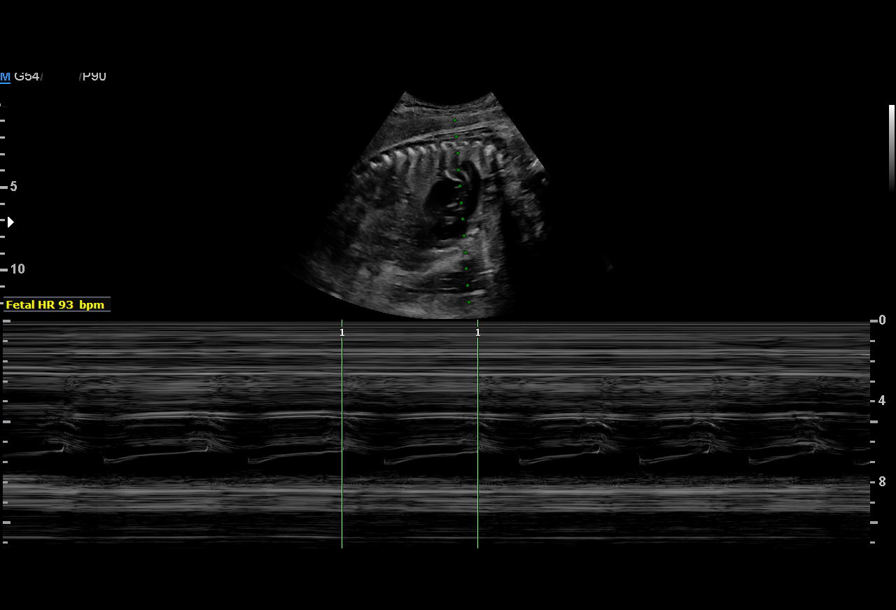

[10 of 10 positions shown; findings below may reference images not displayed]

SAMSONAITE

1  JIANXIN CUY          [id number]      [id number]     [id number]
Indications

35 weeks gestation of pregnancy
Fetal heart rate decelerations affecting       O76
management of mother
OB History

Gravidity:    3         Term:   2        Prem:   0        SAB:   0
TOP:          0       Ectopic:  0        Living: 2
Fetal Evaluation

Num Of Fetuses:     1
Fetal Heart         133
Rate(bpm):
Cardiac Activity:   Observed
Presentation:       Cephalic

Amniotic Fluid
AFI FV:      Oligohydramnios

AFI Sum(cm)     %Tile       Largest Pocket(cm)
5.53            < 3

RUQ(cm)                     LUQ(cm)        LLQ(cm)
2.82
Biophysical Evaluation

Amniotic F.V:   Within normal limits       F. Tone:        Observed
F. Movement:    Observed                   Score:          [DATE]
F. Breathing:   Observed
Gestational Age

LMP:           35w 6d       Date:   01/01/16                 EDD:   10/07/16
Best:          35w 6d    Det. By:   LMP  (01/01/16)          EDD:   10/07/16
Anatomy

Stomach:               Appears normal, left   Bladder:                Appears normal
sided
Impression

IUP at 35+6 weeks with FHR decelerations noted
Oligohydramnios noted, with AFI 5.5 cm
BPP [DATE]
FHR decelerations noted to 90's on US evaluation.
Recommendations

See subsequent US report for recommendations

## 2018-06-30 IMAGING — US US MFM UA CORD DOPPLER
1 series · 13 of 28 positions shown · non-contrast
Comparison: none

[Series 1: us mfm ua cord doppler · 13 of 41 slices shown]
[im 2/41]
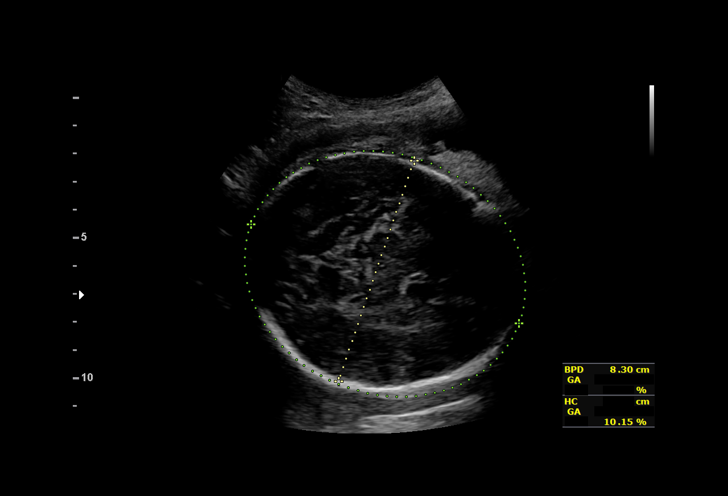
[im 5/41]
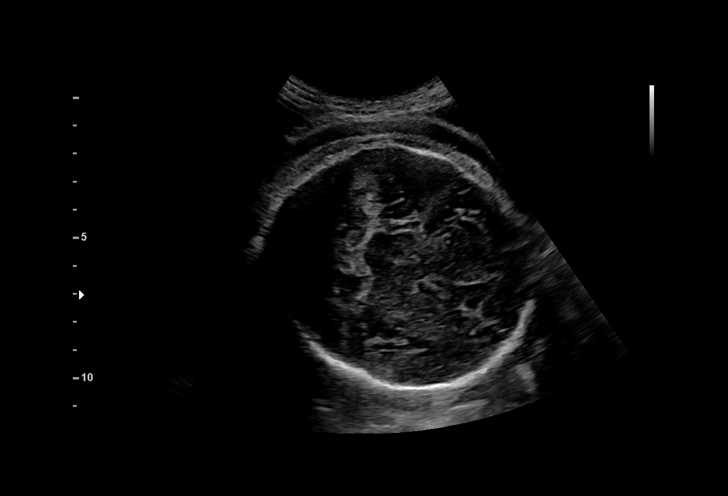
[im 8/41]
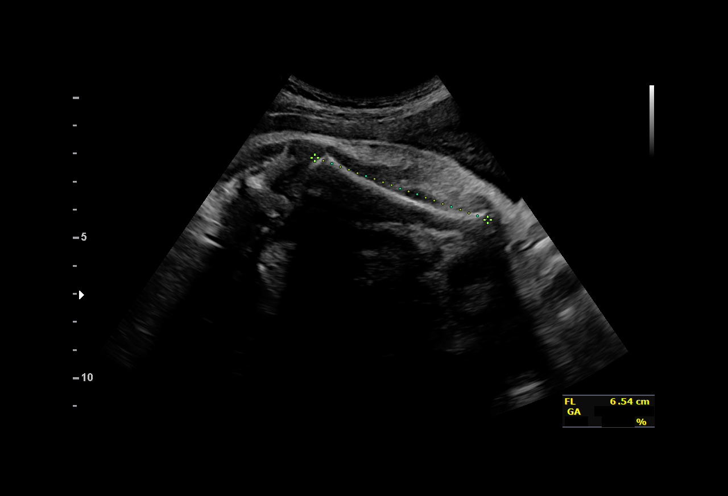
[im 11/41]
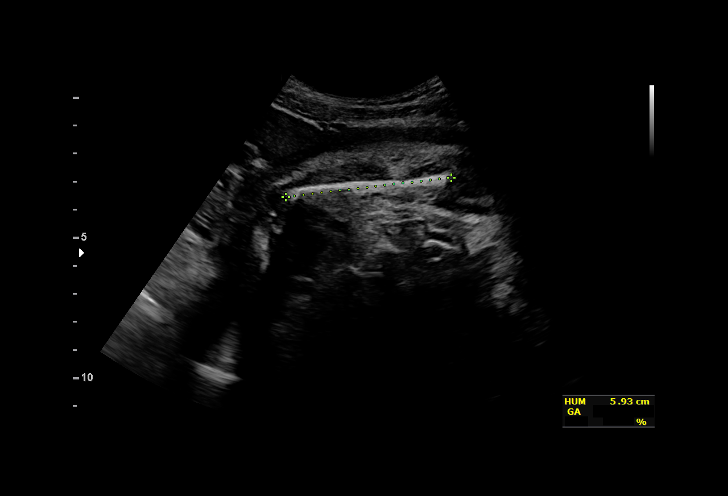
[im 14/41]
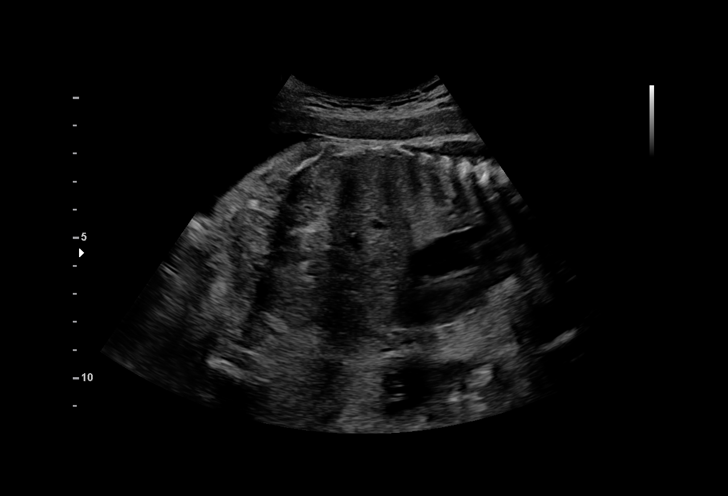
[im 17/41]
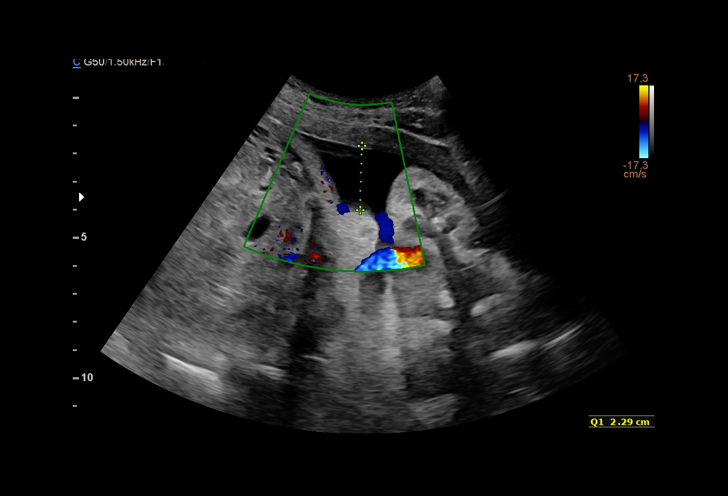
[im 21/41]
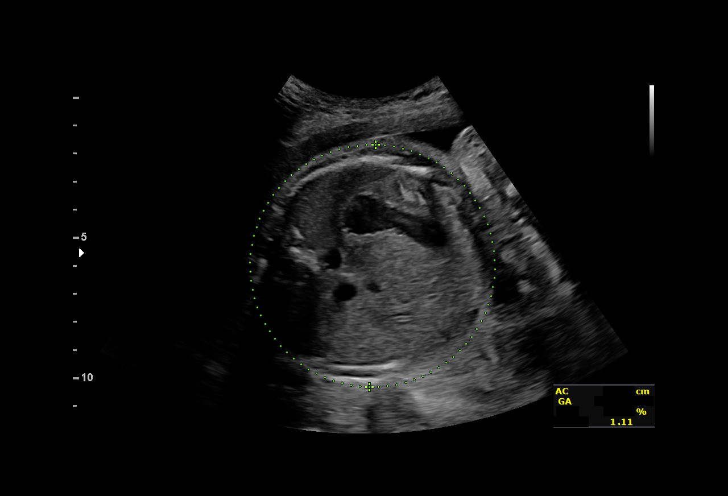
[im 24/41]
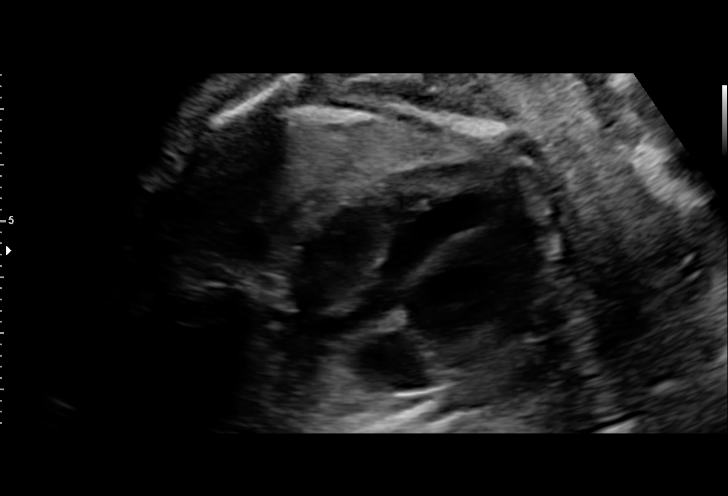
[im 27/41]
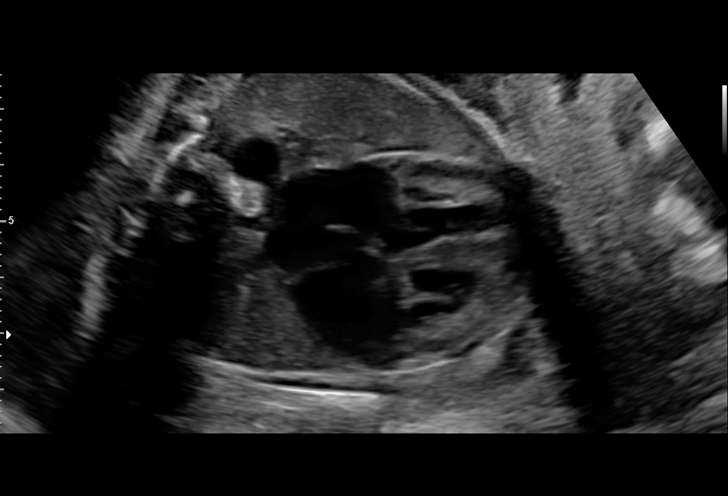
[im 30/41]
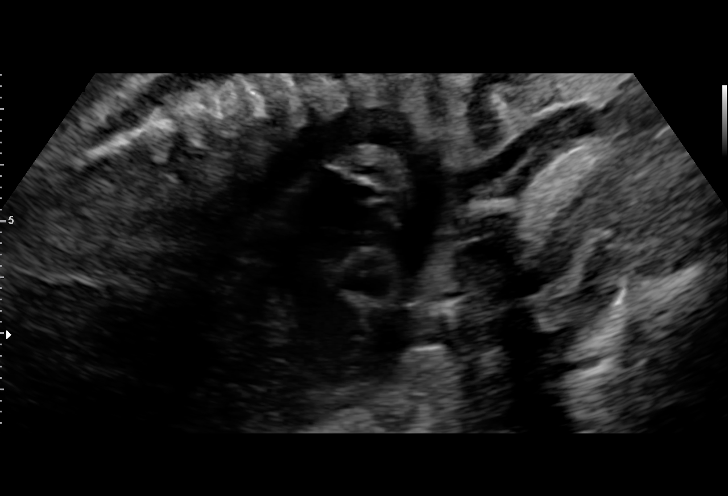
[im 33/41]
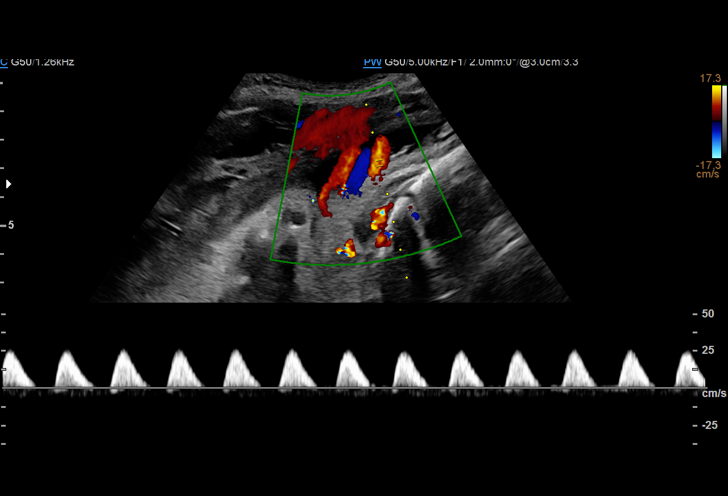
[im 36/41]
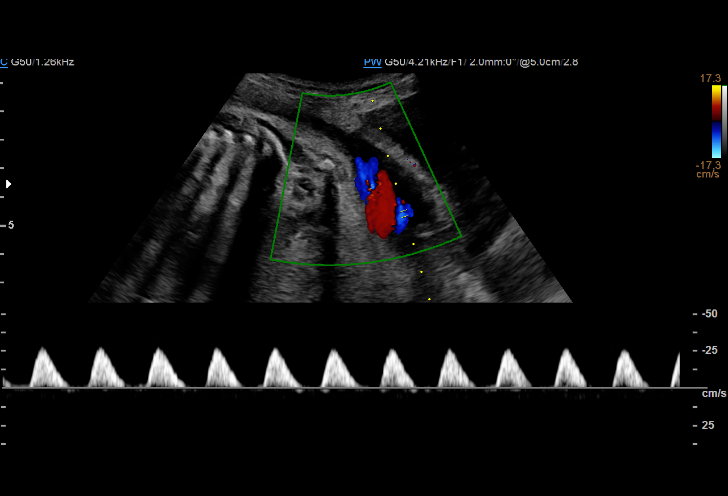
[im 39/41]
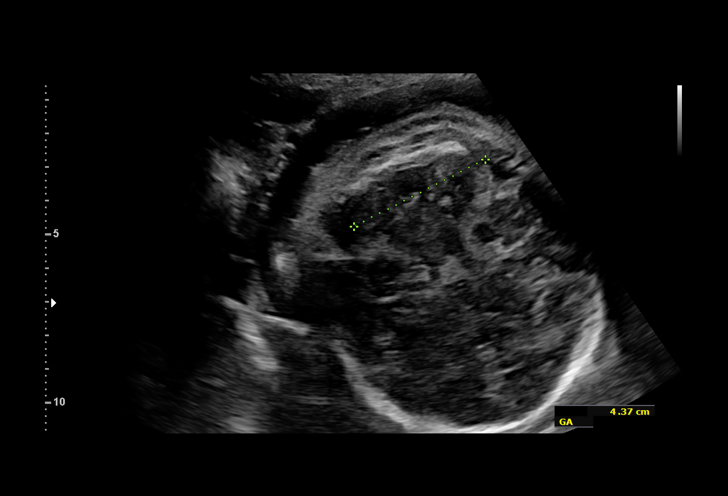

[13 of 28 positions shown; findings below may reference images not displayed]

TUNGAL

1  RTOYOTA JOSHJAX          [id number]      [id number]     [id number]
2  RTOYOTA JOSHJAX          [id number]      [id number]     [id number]
Indications

35 weeks gestation of pregnancy
Fetal heart rate decelerations affecting       O76
management of mother
Small for gestational age fetus affecting
management of mother
Oligohydraminios, third trimester, fetus 1
OB History

Gravidity:    3         Term:   2        Prem:   0        SAB:   0
TOP:          0       Ectopic:  0        Living: 2
Fetal Evaluation

Num Of Fetuses:     1
Fetal Heart         140
Rate(bpm):
Cardiac Activity:   Multiple decels
Presentation:       Cephalic
Placenta:           Fundal, above cervical os

Amniotic Fluid
AFI FV:      Oligohydramnios

AFI Sum(cm)     %Tile       Largest Pocket(cm)
4.43            < 3

RUQ(cm)                     LUQ(cm)
2.24
Biometry

BPD:        83  mm     G. Age:  33w 3d          4  %    CI:         76.7   %   70 - 86
FL/HC:      21.2   %   20.1 -
HC:      300.2  mm     G. Age:  33w 2d        < 3  %    HC/AC:      1.08       0.93 -
AC:      276.9  mm     G. Age:  31w 6d        < 3  %    FL/BPD:     76.6   %   71 - 87
FL:       63.6  mm     G. Age:  32w 6d        < 3  %    FL/AC:      23.0   %   20 - 24
HUM:      59.2  mm     G. Age:  34w 2d         36  %
CER:      45.1  mm     G. Age:  N/A            79  %

Est. FW:    6225  gm      4 lb 5 oz   < 10  %
Gestational Age

LMP:           35w 6d       Date:   01/01/16                 EDD:   10/07/16
U/S Today:     32w 6d                                        EDD:   10/28/16
Best:          35w 6d    Det. By:   LMP  (01/01/16)          EDD:   10/07/16
Anatomy

Cranium:               Appears normal         Aortic Arch:            Previously seen
Cavum:                 Appears normal         Ductal Arch:            Appears normal
Ventricles:            Not well visualized    Diaphragm:              Appears normal
Choroid Plexus:        Not well visualized    Stomach:                Appears normal, left
sided
Cerebellum:            Appears normal         Abdomen:                Appears normal
Posterior Fossa:       Appears normal         Abdominal Wall:         Previously seen
Nuchal Fold:           Previously seen        Cord Vessels:           Appears normal (3
vessel cord)
Face:                  Orbits and profile     Kidneys:                Appear normal
previously seen
Lips:                  Previously seen        Bladder:                Appears normal
Thoracic:              Appears normal         Spine:                  Previously seen
Heart:                 Appears normal         Upper Extremities:      Previously seen
(4CH, axis, and si
RVOT:                  Appears normal         Lower Extremities:      Previously seen
LVOT:                  Appears normal

Other:  Fetus appears to be a male. Heels and 5th digit previously visualized.
Doppler - Fetal Vessels

Umbilical Artery
ADFV
Yes

Cervix Uterus Adnexa

Cervix
Not visualized (advanced GA >90wks)

Left Ovary
Previously seen.

Right Ovary
Previously seen
Impression

Singleton intrauterine pregnancy at 35+6 weeks, with FHR
decelerations, SGA, and oligohydramnios
Review of the anatomy shows no sonographic markers for
aneuploidy or structural anomalies
However, evaluations should be considered suboptimal
secondary to late gestational age and oligohydramnios
Amniotic fluid volume is significantly decreased with an AFI of
4.4 cm
Estimated fetal weight is 6225 g which is growth at <10th
percentile
Dopplers show absent end diastolic flow in all interrogations
Recommendations

This patient has significant IUGR with worrisome features
(AEDF and oligohydramnios)
It is my understanding she has had an initial dose of
Nozaki. I would repeat the dose at 12 hours and
proceed with delivery 12 hours after that, but would keep this
patient on continuous EFM throughout, and proceed with
delivery immediately if FHR monitoring shows deterioration of
fetal status. The patient should be counseled that there is a
high probability of cesarean delivery due to likely fetal
intolerance of labor

## 2018-07-28 ENCOUNTER — Emergency Department
Admission: EM | Admit: 2018-07-28 | Discharge: 2018-07-28 | Disposition: A | Discharge status: Home / Self Care | Payer: Medicaid Other | Source: Home / Self Care

## 2018-07-28 ENCOUNTER — Encounter: Payer: Self-pay | Admitting: Family Medicine

## 2018-07-28 DIAGNOSIS — J069 Acute upper respiratory infection, unspecified: Secondary | ICD-10-CM

## 2018-07-28 DIAGNOSIS — H65112 Acute and subacute allergic otitis media (mucoid) (sanguinous) (serous), left ear: Secondary | ICD-10-CM

## 2018-07-28 MED ORDER — AMOXICILLIN-POT CLAVULANATE 400-57 MG/5ML PO SUSR: 400.0000 mg | Freq: Three times a day (TID) | ORAL | 0 refills | Status: AC

## 2018-07-28 NOTE — ED Provider Notes (Signed)
[facility]    CSN: [id number] Arrival date & time: 07/28/18  1813     History   Chief Complaint Chief Complaint  Patient presents with  . URI    HPI Evelyn Cox is a 27 y.o. female.   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 PT reports congestion, facial pain, tooth pain, body aches for 2 days. PT reports sore throat Tuesday, but that has improved.  She also has some left ear pain.     History reviewed. No pertinent past medical history.  There are no active problems to display for this patient.   History reviewed. No pertinent surgical history.  OB History    Gravida  3   Para  2   Term  2   Preterm      AB      Living  2     SAB      TAB      Ectopic      Multiple      Live Births  1            Home Medications    Prior to Admission medications   Medication Sig Start Date End Date Taking? Authorizing Provider  amoxicillin-clavulanate (AUGMENTIN) 400-57 MG/5ML suspension Take 5 mLs (400 mg total) by mouth 3 (three) times daily for 7 days. 07/28/18 08/04/18  Elvina Sidle, MD    Family History Family History  Problem Relation Age of Onset  . Hypertension Mother     Social History Social History   Tobacco Use  . Smoking status: Never Smoker  . Smokeless tobacco: Never Used  Substance Use Topics  . Alcohol use: Yes    Alcohol/week: 2.0 standard drinks    Types: 2 Standard drinks or equivalent per week    Comment: occasional  . Drug use: No     Allergies   Patient has no known allergies.   Review of Systems Review of Systems  HENT: Positive for congestion, ear pain and sore throat.   Respiratory: Positive for cough.   All other systems reviewed and are negative.    Physical Exam Triage Vital Signs ED Triage Vitals  Enc Vitals Group     BP 07/28/18 1833 105/71     Pulse Rate 07/28/18 1833 92     Resp 07/28/18 1833 16     Temp 07/28/18 1833 98.9 F (37.2 C)   Temp Source 07/28/18 1833 Oral     SpO2 07/28/18 1833 99 %     Weight 07/28/18 1834 108 lb (49 kg)     Height --      Head Circumference --      Peak Flow --      Pain Score 07/28/18 1833 8     Pain Loc --      Pain Edu? --      Excl. in GC? --    No data found.  Updated Vital Signs BP 105/71   Pulse 92   Temp 98.9 F (37.2 C) (Oral)   Resp 16   Wt 49 kg   LMP 06/26/2018   SpO2 99%   BMI 16.92 kg/m    Physical Exam  Constitutional: She is oriented to person, place, and time. She appears well-developed and well-nourished.  HENT:  Right Ear: External ear normal.  Left Ear: External ear normal.  Mouth/Throat: Oropharynx is clear and moist.  Dull left TM  Eyes: Pupils are equal, round, and  reactive to light. Conjunctivae are normal.  Neck: Normal range of motion. Neck supple.  Cardiovascular: Normal rate, regular rhythm and normal heart sounds.  Pulmonary/Chest: Effort normal and breath sounds normal.  Musculoskeletal: Normal range of motion.  Neurological: She is alert and oriented to person, place, and time.  Skin: Skin is warm and dry.  Psychiatric: She has a normal mood and affect. Her behavior is normal. Judgment and thought content normal.  Nursing note and vitals reviewed.    UC Treatments / Results  Labs (all labs ordered are listed, but only abnormal results are displayed) Labs Reviewed - No data to display  EKG None  Radiology No results found.  Procedures Procedures (including critical care time)  Medications Ordered in UC Medications - No data to display  Initial Impression / Assessment and Plan / UC Course  I have reviewed the triage vital signs and the nursing notes.  Pertinent labs & imaging results that were available during my care of the patient were reviewed by me and considered in my medical decision making (see chart for details).    Final Clinical Impressions(s) / UC Diagnoses   Final diagnoses:  Acute mucoid otitis media of  left ear  Upper respiratory tract infection, unspecified type   Discharge Instructions   None    ED Prescriptions    Medication Sig Dispense Auth. Provider   amoxicillin-clavulanate (AUGMENTIN) 400-57 MG/5ML suspension Take 5 mLs (400 mg total) by mouth 3 (three) times daily for 7 days. 100 mL Elvina Sidle, MD     Controlled Substance Prescriptions [place] Controlled Substance Registry consulted? Not Applicable   Elvina Sidle, MD 07/28/18 [phone]

## 2018-07-28 NOTE — ED Triage Notes (Signed)
PT reports congestion, facial pain, tooth pain, body aches for 2 days. PT reports sore throat Tuesday, but that has improved.
